# Patient Record
Sex: Female | Born: 1965 | Race: Black or African American | Hispanic: No | Marital: Married | Smoking: Never smoker
Health system: Southern US, Community
[De-identification: ages and names within clinical notes are randomized; demographics above are authoritative.]

## PROBLEM LIST (undated history)

## (undated) DIAGNOSIS — G43909 Migraine, unspecified, not intractable, without status migrainosus: Secondary | ICD-10-CM

## (undated) DIAGNOSIS — J45909 Unspecified asthma, uncomplicated: Secondary | ICD-10-CM

## (undated) DIAGNOSIS — Z86718 Personal history of other venous thrombosis and embolism: Secondary | ICD-10-CM

## (undated) DIAGNOSIS — E039 Hypothyroidism, unspecified: Secondary | ICD-10-CM

## (undated) DIAGNOSIS — I10 Essential (primary) hypertension: Secondary | ICD-10-CM

## (undated) DIAGNOSIS — K2950 Unspecified chronic gastritis without bleeding: Secondary | ICD-10-CM

## (undated) DIAGNOSIS — D126 Benign neoplasm of colon, unspecified: Secondary | ICD-10-CM

## (undated) DIAGNOSIS — D131 Benign neoplasm of stomach: Secondary | ICD-10-CM

## (undated) DIAGNOSIS — K219 Gastro-esophageal reflux disease without esophagitis: Secondary | ICD-10-CM

## (undated) DIAGNOSIS — K2210 Ulcer of esophagus without bleeding: Secondary | ICD-10-CM

## (undated) DIAGNOSIS — E119 Type 2 diabetes mellitus without complications: Secondary | ICD-10-CM

## (undated) DIAGNOSIS — G4730 Sleep apnea, unspecified: Secondary | ICD-10-CM

## (undated) DIAGNOSIS — Z8719 Personal history of other diseases of the digestive system: Secondary | ICD-10-CM

## (undated) HISTORY — PX: LAPAROSCOPIC SALPINGO OOPHERECTOMY: SHX5927

## (undated) HISTORY — PX: CHOLECYSTECTOMY: SHX55

---

## 2008-03-03 ENCOUNTER — Observation Stay (HOSPITAL_COMMUNITY): Admission: EM | Admit: 2008-03-03 | Discharge: 2008-03-07 | Payer: Self-pay | Admitting: Emergency Medicine

## 2008-08-04 ENCOUNTER — Emergency Department (HOSPITAL_COMMUNITY): Admission: EM | Admit: 2008-08-04 | Discharge: 2008-08-04 | Payer: Self-pay | Admitting: Emergency Medicine

## 2010-02-16 ENCOUNTER — Emergency Department (HOSPITAL_COMMUNITY): Admission: EM | Admit: 2010-02-16 | Discharge: 2010-02-16 | Payer: Self-pay | Admitting: Emergency Medicine

## 2011-03-18 NOTE — Discharge Summary (Signed)
Stacey Santos            ACCOUNT NO.:  [id number]   MEDICAL RECORD NO.:  [id number]          PATIENT TYPE:  INP   LOCATION:  A338                          FACILITY:  [facility]   PHYSICIAN:  Dorris Singh, DO    DATE OF BIRTH:  [birth year]   DATE OF ADMISSION:  03/03/2008  DATE OF DISCHARGE:  05/05/2009LH                               DISCHARGE SUMMARY   ADMISSION DIAGNOSIS:  1. Acute sciatica.  2. Pain management.  3. History of dental caries.   DISCHARGE DIAGNOSIS:  1. Acute sciatica.  2. Dyspepsia.  3. Pain management.  4. History of dental caries.   HISTORY OF PRESENT ILLNESS:  H&P was done by Dr. Dorris Singh, please  refer to that, but to summarize:  The patient is a 45 year old, African-  American female who presented to the [facility] emergency room with a  history of back pain that had been progressing over the last 2 days.  The patient was admitted to the service of [facility]. It was found that  she had acute sciatica.  The patient also had a lumbar spine done on Mar 04, 2008 which showed no acute process.  She was started on IV pain  medications as well as IV steroids and antiemetics.  She continued to  progress slowly.  PT, OT also saw her as well. After the first 24 hours,  she was able to get up and do some physical therapy but was in extreme  pain afterwards. She was also started on muscle relaxers. She continued  to progress on May 3. On May 2, we started her on anti-inflammatories  particularly Motrin. On May 3,  she started to complain of dyspepsia. At  that point in time, the Motrin was discontinued and the patient was  started on b.i.d. PPI. On the 4th, she continued to do better  progressing slowly.  She was able to have more range of motion,  able to  do more with physical therapy. On May 5, it was determined that she  could be discharged to home and manage this. We will have home health  care come out and see her as well as PT, OT to follow up  with her too.  She will be sent home on the following medications.  She was on  penicillin V 500 mg 4 times a day prior to coming here due to some  dental issues and also on hydrocodone acetaminophen as needed.  She will  be sent home with  new prescription for Protonix 40 mg p.o. b.i.d. while  on medications for sciatica, Skelaxin 800 mg p.o. b.i.d., Medrol Dosepak  to use as directed. She is to take Tylenol extra strength over-the-  counter as directed for pain and she will be given Darvocet-N 100 to  take 1 every 4-6 hours for breakthrough pain.  She is recommended to  follow up, since she does not have a primary care physician, with the  physician on-call who is Dr. Felecia Shelling in 1-3 days.  We will try to get her  scheduled by the end of this week.   CONDITION ON  DISCHARGE:  Stable.   DISPOSITION:  To home.   She is to follow up if her symptoms worsen with her primary care  physician or to the ER.      Dorris Singh, DO  Electronically Signed     CB/MEDQ  D:  03/07/2008  T:  03/07/2008  Job:  [id number]

## 2011-03-18 NOTE — H&P (Signed)
Stacey Santos, Stacey Santos            ACCOUNT NO.:  [id number]   MEDICAL RECORD NO.:  [id number]          PATIENT TYPE:  INP   LOCATION:  A338                          FACILITY:  [facility]   PHYSICIAN:  Dorris Singh, DO    DATE OF BIRTH:  12-19-1965   DATE OF ADMISSION:  03/03/2008  DATE OF DISCHARGE:  LH                              HISTORY & PHYSICAL   HISTORY:  The patient's primary care physician:  She is unassigned.   CHIEF COMPLAINT:  Back pain.   The patient is a 45 year old African-American female who presented to  the emergency room via ambulance, after having a three-day history of  excruciating back pain.  The patient stated that she tried home remedies  at home, which did not help.  The back pain extended from her buttocks,  down to her left heel.  The pain became more intense, rated a 10/10.  She was able to go to work in the morning and realized that, when she  was at work, it continued to get worse.  At that point in time, she  tried home remedies which did not relieve any pain.  She then called  EMS, who gave her 4 mg of morphine, while she was being transported to  the [facility] ED.  She was evaluated by Dr. Deretha Emory, and while the  patient was there she remained in excruciating pain.  She was unable to  move at that point in time, and it was determined the patient should be  admitted for pain management.   PAST MEDICAL HISTORY:  Significant for asthma, back pain, chronic deep  venous thrombosis.   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   MEDICATIONS:  She is on hydrocodone for pain management as needed for a  dental procedure, for dental pain and penicillin V potassium 4 times a  day for dental pain.   ALLERGIES:  THE PATIENT SAYS THAT SHE DOES NOT HAVE A TRUE ALLERGY TO  HYDROMORPHONE, JUST SHE DOES NOT LIKE HOW IT MAKES IT HER FEEL.   REVIEW OF SYSTEMS:  CONSTITUTIONAL:   Negative for weakness or appetite  changes or fever or chills.  HEENT:  Negative for ear pain, nose pain,  but positive for dental pain.  Negative for changes in vision.  HEART:  Negative for chest pain, palpitations.  PULMONARY:  Negative for dyspnea  or wheezing.  GI:  Negative for abdominal pain __________.  MUSCULOSKELETAL:  Positive for arthralgias and nerve pain.  NEUROLOGIC:  Positive for painful neuropathy or radiculopathy of the left lower leg.   PHYSICAL EXAMINATION:  VITAL SIGNS:  Are as follows:  Blood pressure  134/75, pulse rate 90, respirations 18, pulse ox 96%.  GENERAL:  The patient is a 45 year old African-American female who is  well-developed, well-nourished, in no acute distress.  HEENT:  Head is normocephalic, atraumatic.  Eyes are PERRLA, EOMI.  Teeth:  She does have dentition that is in fair repair.  NECK:  Supple, no lymphadenopathy noted.  HEART:  Regular rate  and rhythm, no murmur noted.  LUNGS:  Clear to auscultation bilaterally.  ABDOMEN:  Soft, nontender, nondistended.  EXTREMITIES:  The patient is laying on left side, is propped up.  There  is no addition swelling, as of left thigh region, however it is tender  to the touch.  There is no ecchymosis or edema noted.   LABORATORY:  Her labs are as follows:  Her BMET:  Sodium is 137,  potassium 3.0, chloride 103, carbon dioxide 27, glucose 106, BUN 4,  creatinine 0.65.  Her white count is 4.2, hemoglobin 11.1, hematocrit  32.0, platelet count 411.   ASSESSMENT/PLAN:  1. Acute sciatica.  2. Pain management.  3. History of dental caries.   PLAN:  Will be admit patient to the service of [facility].  Will  institute IV steroids and IV pain medications, as well as coverage for  glycemic protocol, antiemetics and DVT and GI prophylaxis.  Will  continue the patient on home medications that are appropriate, and will  also get a lumbar x-ray of the spine in the morning.  Will also get  muscle relaxers as well,  anti-spasmodics to see if that is helpful to  her and will have PT/OT come and see her, as well.  Anticipate discharge  in 1 to 2 days.      Dorris Singh, DO  Electronically Signed     CB/MEDQ  D:  03/03/2008  T:  03/03/2008  Job:  [id number]

## 2011-08-04 LAB — COMPREHENSIVE METABOLIC PANEL
Albumin: 3.5
BUN: 3 — ABNORMAL LOW
Calcium: 9.9
Creatinine, Ser: 0.69
GFR calc non Af Amer: >60
Total Protein: 7.5

## 2011-08-04 LAB — DIFFERENTIAL
Basophils Absolute: 0
Basophils Relative: 1
Eosinophils Absolute: 0
Eosinophils Relative: 1
Lymphocytes Relative: 43
Monocytes Absolute: 0.4

## 2011-08-04 LAB — CBC
HCT: 34.5 — ABNORMAL LOW
Hemoglobin: 11.2 — ABNORMAL LOW
RBC: 4.64
RDW: 17.9 — ABNORMAL HIGH
WBC: 4

## 2011-08-04 LAB — LIPASE, BLOOD: Lipase: 21

## 2012-05-20 ENCOUNTER — Encounter (HOSPITAL_COMMUNITY): Payer: Self-pay | Admitting: *Deleted

## 2012-05-20 ENCOUNTER — Emergency Department (HOSPITAL_COMMUNITY)
Admission: EM | Admit: 2012-05-20 | Discharge: 2012-05-20 | Disposition: A | Discharge status: Home / Self Care | Payer: Self-pay | Attending: Emergency Medicine | Admitting: Emergency Medicine

## 2012-05-20 DIAGNOSIS — G8929 Other chronic pain: Secondary | ICD-10-CM | POA: Insufficient documentation

## 2012-05-20 DIAGNOSIS — R509 Fever, unspecified: Secondary | ICD-10-CM | POA: Insufficient documentation

## 2012-05-20 DIAGNOSIS — J029 Acute pharyngitis, unspecified: Secondary | ICD-10-CM | POA: Insufficient documentation

## 2012-05-20 LAB — RAPID STREP SCREEN (MED CTR MEBANE ONLY): Streptococcus, Group A Screen (Direct): NEGATIVE

## 2012-05-20 MED ORDER — HYDROCODONE-ACETAMINOPHEN 5-325 MG PO TABS: ORAL_TABLET | ORAL | Status: AC

## 2012-05-20 MED ORDER — IBUPROFEN 800 MG PO TABS
800.0000 mg | ORAL_TABLET | Freq: Once | ORAL | Status: AC
  Administered 2012-05-20: 800 mg via ORAL
  Filled 2012-05-20: qty 1

## 2012-05-20 MED ORDER — IBUPROFEN 800 MG PO TABS: 800.0000 mg | ORAL_TABLET | Freq: Three times a day (TID) | ORAL | Status: AC

## 2012-05-20 MED ORDER — AZITHROMYCIN 250 MG PO TABS: ORAL_TABLET | ORAL | Status: DC

## 2012-05-20 MED ORDER — HYDROCODONE-ACETAMINOPHEN 5-325 MG PO TABS
1.0000 | ORAL_TABLET | Freq: Once | ORAL | Status: AC
  Administered 2012-05-20: 1 via ORAL
  Filled 2012-05-20: qty 1

## 2012-05-20 NOTE — ED Notes (Signed)
Pt c/o sore throat, fever, bilateral ear pain and weak since yesterday.

## 2012-05-20 NOTE — Discharge Instructions (Signed)

## 2012-05-20 NOTE — ED Provider Notes (Signed)
History     CSN: [id number]  Arrival date & time 05/20/12  1818   First MD Initiated Contact with Patient 05/20/12 1826      Chief Complaint  Patient presents with  . Sore Throat    (Consider location/radiation/quality/duration/timing/severity/associated sxs/prior treatment) Patient is a 46 y.o. female presenting with pharyngitis. The history is provided by the patient.  Sore Throat This is a new problem. The current episode started in the past 7 days. The problem occurs constantly. The problem has been unchanged. Associated symptoms include congestion, a fever and a sore throat. Pertinent negatives include no arthralgias, chest pain, chills, coughing, headaches, nausea, neck pain, numbness, rash, visual change, vomiting or weakness. Associated symptoms comments: Ear pain. Nothing aggravates the symptoms. She has tried nothing for the symptoms. The treatment provided no relief.    History reviewed. No pertinent past medical history.  History reviewed. No pertinent past surgical history.  History reviewed. No pertinent family history.  History  Substance Use Topics  . Smoking status: Never Smoker   . Smokeless tobacco: Not on file  . Alcohol Use: No    OB History    Grav Para Term Preterm Abortions TAB SAB Ect Mult Living                  Review of Systems  Constitutional: Positive for fever. Negative for chills, activity change and appetite change.  HENT: Positive for ear pain, congestion, sore throat and rhinorrhea. Negative for facial swelling, trouble swallowing, neck pain and neck stiffness.   Eyes: Negative for visual disturbance.  Respiratory: Negative for cough, shortness of breath, wheezing and stridor.   Cardiovascular: Negative for chest pain.  Gastrointestinal: Negative for nausea and vomiting.  Musculoskeletal: Negative for arthralgias.  Skin: Negative.  Negative for rash.  Neurological: Negative for dizziness, weakness, numbness and headaches.    Hematological: Negative for adenopathy.  Psychiatric/Behavioral: Negative for confusion.  All other systems reviewed and are negative.    Allergies  Review of patient's allergies indicates no known allergies.  Home Medications  No current outpatient prescriptions on file.  BP 157/86  Pulse 119  Temp 100.2 F (37.9 C) (Oral)  Resp 18  Ht 5\' 2"  (1.575 m)  Wt 210 lb (95.255 kg)  BMI 38.41 kg/m2  SpO2 99%  LMP 05/11/2012  Physical Exam  Nursing note and vitals reviewed. Constitutional: She is oriented to person, place, and time. She appears well-developed and well-nourished. No distress.  HENT:  Head: Normocephalic and atraumatic. No trismus in the jaw.  Right Ear: Tympanic membrane and ear canal normal.  Left Ear: Tympanic membrane and ear canal normal.  Mouth/Throat: Uvula is midline and mucous membranes are normal. No uvula swelling. Posterior oropharyngeal edema and posterior oropharyngeal erythema present. No oropharyngeal exudate or tonsillar abscesses.  Neck: Normal range of motion. Neck supple.  Cardiovascular: Normal rate, regular rhythm and normal heart sounds.   Pulmonary/Chest: Effort normal and breath sounds normal.  Abdominal: Soft. She exhibits no distension. There is no tenderness.  Musculoskeletal: Normal range of motion.  Lymphadenopathy:    She has no cervical adenopathy.  Neurological: She is alert and oriented to person, place, and time. She exhibits normal muscle tone. Coordination normal.  Skin: Skin is warm and dry.    ED Course  Procedures (including critical care time)  Results for orders placed during the hospital encounter of 05/20/12  RAPID STREP SCREEN      Component Value Range   Streptococcus, Group A Screen (Direct)  NEGATIVE  NEGATIVE         MDM     Patient is feeling better, erythema of the bilateral tonsils.  No exudates. Uvula is midline.  Handles own secretions well.  Non-toxic appearing.  The patient appears reasonably  screened and/or stabilized for discharge and I doubt any other medical condition or other [facility] requiring further screening, evaluation, or treatment in the ED at this time prior to discharge.   Patient / Family / Caregiver understand and agree with initial ED impression and plan with expectations set for ED visit. Pt stable in ED with no significant deterioration in condition. Pt feels improved after observation and/or treatment in ED.    Prescribed: zithromax norco #10 ibuprofen        Constantine Ruddick L. [place], [place] 05/26/12 2216

## 2012-05-20 NOTE — ED Notes (Signed)
Sore throat, ears hurt,  Hoarse.  Fever ,onset yesterday

## 2012-05-28 NOTE — ED Provider Notes (Signed)
Medical screening examination/treatment/procedure(s) were performed by non-physician practitioner and as supervising physician I was immediately available for consultation/collaboration.  Farmer Mccahill, MD 05/28/12 0744 

## 2014-07-25 DIAGNOSIS — K219 Gastro-esophageal reflux disease without esophagitis: Secondary | ICD-10-CM | POA: Insufficient documentation

## 2014-07-25 DIAGNOSIS — M545 Low back pain, unspecified: Secondary | ICD-10-CM | POA: Insufficient documentation

## 2014-07-31 ENCOUNTER — Ambulatory Visit: Payer: Self-pay | Admitting: Internal Medicine

## 2014-08-17 DIAGNOSIS — M5136 Other intervertebral disc degeneration, lumbar region: Secondary | ICD-10-CM | POA: Insufficient documentation

## 2014-08-31 DIAGNOSIS — M160 Bilateral primary osteoarthritis of hip: Secondary | ICD-10-CM | POA: Insufficient documentation

## 2014-12-21 ENCOUNTER — Ambulatory Visit: Payer: Self-pay | Admitting: Internal Medicine

## 2015-04-09 DIAGNOSIS — D649 Anemia, unspecified: Secondary | ICD-10-CM | POA: Insufficient documentation

## 2015-04-30 ENCOUNTER — Encounter: Payer: Self-pay | Admitting: *Deleted

## 2015-05-01 ENCOUNTER — Ambulatory Visit: Payer: BLUE CROSS/BLUE SHIELD | Admitting: Anesthesiology

## 2015-05-01 ENCOUNTER — Ambulatory Visit
Admission: RE | Admit: 2015-05-01 | Discharge: 2015-05-01 | Disposition: A | Discharge status: Home / Self Care | Payer: BLUE CROSS/BLUE SHIELD | Source: Ambulatory Visit | Attending: Gastroenterology | Admitting: Gastroenterology

## 2015-05-01 ENCOUNTER — Encounter
Admission: RE | Disposition: A | Discharge status: Home / Self Care | Payer: Self-pay | Source: Ambulatory Visit | Attending: Gastroenterology

## 2015-05-01 ENCOUNTER — Encounter: Payer: Self-pay | Admitting: *Deleted

## 2015-05-01 DIAGNOSIS — Z7951 Long term (current) use of inhaled steroids: Secondary | ICD-10-CM | POA: Diagnosis not present

## 2015-05-01 DIAGNOSIS — E669 Obesity, unspecified: Secondary | ICD-10-CM | POA: Diagnosis not present

## 2015-05-01 DIAGNOSIS — Z86010 Personal history of colonic polyps: Secondary | ICD-10-CM | POA: Diagnosis not present

## 2015-05-01 DIAGNOSIS — D124 Benign neoplasm of descending colon: Secondary | ICD-10-CM | POA: Diagnosis not present

## 2015-05-01 DIAGNOSIS — Z6841 Body Mass Index (BMI) 40.0 and over, adult: Secondary | ICD-10-CM | POA: Diagnosis not present

## 2015-05-01 DIAGNOSIS — Z79899 Other long term (current) drug therapy: Secondary | ICD-10-CM | POA: Insufficient documentation

## 2015-05-01 DIAGNOSIS — K449 Diaphragmatic hernia without obstruction or gangrene: Secondary | ICD-10-CM | POA: Diagnosis not present

## 2015-05-01 DIAGNOSIS — J45909 Unspecified asthma, uncomplicated: Secondary | ICD-10-CM | POA: Diagnosis not present

## 2015-05-01 DIAGNOSIS — K2950 Unspecified chronic gastritis without bleeding: Secondary | ICD-10-CM | POA: Diagnosis not present

## 2015-05-01 DIAGNOSIS — K317 Polyp of stomach and duodenum: Secondary | ICD-10-CM | POA: Diagnosis not present

## 2015-05-01 DIAGNOSIS — K219 Gastro-esophageal reflux disease without esophagitis: Secondary | ICD-10-CM | POA: Insufficient documentation

## 2015-05-01 DIAGNOSIS — Z1211 Encounter for screening for malignant neoplasm of colon: Secondary | ICD-10-CM | POA: Diagnosis present

## 2015-05-01 HISTORY — DX: Personal history of other venous thrombosis and embolism: Z86.718

## 2015-05-01 HISTORY — DX: Unspecified asthma, uncomplicated: J45.909

## 2015-05-01 HISTORY — PX: ESOPHAGOGASTRODUODENOSCOPY: SHX5428

## 2015-05-01 HISTORY — PX: COLONOSCOPY WITH PROPOFOL: SHX5780

## 2015-05-01 HISTORY — DX: Migraine, unspecified, not intractable, without status migrainosus: G43.909

## 2015-05-01 SURGERY — COLONOSCOPY WITH PROPOFOL
Anesthesia: General

## 2015-05-01 MED ORDER — PROPOFOL 10 MG/ML IV BOLUS
INTRAVENOUS | Status: DC | PRN
  Administered 2015-05-01: 20 mg via INTRAVENOUS
  Administered 2015-05-01 (×2): 30 mg via INTRAVENOUS
  Administered 2015-05-01: 50 mg via INTRAVENOUS

## 2015-05-01 MED ORDER — LIDOCAINE HCL (CARDIAC) 20 MG/ML IV SOLN
INTRAVENOUS | Status: DC | PRN
  Administered 2015-05-01: 100 mg via INTRAVENOUS

## 2015-05-01 MED ORDER — FENTANYL CITRATE (PF) 100 MCG/2ML IJ SOLN
25.0000 ug | INTRAMUSCULAR | Status: DC | PRN
  Administered 2015-05-01: 50 ug via INTRAVENOUS

## 2015-05-01 MED ORDER — SODIUM CHLORIDE 0.9 % IV SOLN
INTRAVENOUS | Status: DC
  Administered 2015-05-01: 09:00:00 via INTRAVENOUS
  Administered 2015-05-01: 1000 mL via INTRAVENOUS

## 2015-05-01 MED ORDER — ONDANSETRON HCL 4 MG/2ML IJ SOLN: 4.0000 mg | Freq: Once | INTRAMUSCULAR | Status: DC | PRN

## 2015-05-01 MED ORDER — MIDAZOLAM HCL 5 MG/5ML IJ SOLN
INTRAMUSCULAR | Status: DC | PRN
  Administered 2015-05-01: 2 mg via INTRAVENOUS

## 2015-05-01 MED ORDER — EPHEDRINE SULFATE 50 MG/ML IJ SOLN
INTRAMUSCULAR | Status: DC | PRN
  Administered 2015-05-01: 15 mg via INTRAVENOUS

## 2015-05-01 MED ORDER — PROPOFOL INFUSION 10 MG/ML OPTIME
INTRAVENOUS | Status: DC | PRN
Start: 2015-05-01 — End: 2015-05-01
  Administered 2015-05-01 (×2): 140 ug/kg/min via INTRAVENOUS

## 2015-05-01 NOTE — Op Note (Signed)
[facility] Patient Name: Stacey Santos Procedure Date: 05/01/2015 8:54 AM MRN: [id number] Account #: [id number] Date of Birth: 01/24/1966 Admit Type: Outpatient Age: 49 Room: [facility] ENDO ROOM 3 Gender: Female Note Status: Finalized Procedure:         Colonoscopy Indications:       Screening for colorectal malignant neoplasm, This is the                     patient's first colonoscopy Providers:         Lollie Sails, MD Referring MD:      Glendon Axe (Referring MD) Medicines:         Monitored Anesthesia Care Complications:     No immediate complications. Procedure:         Pre-Anesthesia Assessment:                    - ASA Grade Assessment: III - A patient with severe                     systemic disease.                    After obtaining informed consent, the colonoscope was                     passed under direct vision. Throughout the procedure, the                     patient's blood pressure, pulse, and oxygen saturations                     were monitored continuously. The Colonoscope was                     introduced through the anus and advanced to the the cecum,                     identified by appendiceal orifice and ileocecal valve. The                     colonoscopy was performed with moderate difficulty due to                     a tortuous colon and the patient's body habitus.                     Successful completion of the procedure was aided by                     changing the patient to a prone position and using manual                     pressure. The patient tolerated the procedure well. The                     quality of the bowel preparation was good. Findings:      A 9 mm polyp was found in the proximal descending colon. The polyp was       pedunculated. The polyp was removed with a hot snare. Resection and       retrieval were complete. One hemostatic clip was successfully placed.       There was no  bleeding at the end of the maneuver.  The digital rectal exam was normal.      The exam was otherwise without abnormality. Impression:        - One 9 mm polyp in the proximal descending colon.                     Resected and retrieved. Clip was placed.                    - The examination was otherwise normal. Recommendation:    - Await pathology results.                    - Telephone GI clinic for pathology results in 1 week. Procedure Code(s): --- Professional ---                    [id number], Colonoscopy, flexible; with removal of tumor(s),                     polyp(s), or other lesion(s) by snare technique Diagnosis Code(s): --- Professional ---                    V76.51, Special screening for malignant neoplasms of colon                    211.3, Benign neoplasm of colon CPT copyright 2014 American Medical Association. All rights reserved. The codes documented in this report are preliminary and upon coder review may  be revised to meet current compliance requirements. Lollie Sails, MD 05/01/2015 10:16:15 AM This report has been signed electronically. Number of Addenda: 0 Note Initiated On: 05/01/2015 8:54 AM Scope Withdrawal Time: 0 hours 4 minutes 59 seconds  Total Procedure Duration: 0 hours 24 minutes 17 seconds       [facility]

## 2015-05-01 NOTE — Anesthesia Postprocedure Evaluation (Signed)
  Anesthesia Post-op Note  Patient: Stacey Santos  Procedure(s) Performed: Procedure(s): COLONOSCOPY WITH PROPOFOL (N/A) ESOPHAGOGASTRODUODENOSCOPY (EGD) (N/A)  Anesthesia type:General  Patient location: PACU  Post pain: Pain level controlled  Post assessment: Post-op Vital signs reviewed, Patient's Cardiovascular Status Stable, Respiratory Function Stable, Patent Airway and No signs of Nausea or vomiting  Post vital signs: Reviewed and stable  Last Vitals:  Filed Vitals:   05/01/15 1105  BP: 109/81  Pulse: 90  Temp:   Resp: 19    Level of consciousness: awake, alert  and patient cooperative  Complications: No apparent anesthesia complications

## 2015-05-01 NOTE — OR Nursing (Signed)
Hx of bilateral tubal ligation

## 2015-05-01 NOTE — Op Note (Signed)
[facility] Patient Name: Stacey Santos Procedure Date: 05/01/2015 8:54 AM MRN: [id number] Account #: [id number] Date of Birth: January 20, 1966 Admit Type: Outpatient Age: 49 Room: [facility] ENDO ROOM 3 Gender: Female Note Status: Finalized Procedure:         Upper GI endoscopy Indications:       Gastro-esophageal reflux disease Providers:         Lollie Sails, MD Referring MD:      Glendon Axe (Referring MD) Medicines:         Monitored Anesthesia Care Complications:     No immediate complications. Procedure:         Pre-Anesthesia Assessment:                    - ASA Grade Assessment: III - A patient with severe                     systemic disease.                    After obtaining informed consent, the endoscope was passed                     under direct vision. Throughout the procedure, the                     patient's blood pressure, pulse, and oxygen saturations                     were monitored continuously. The Olympus GIF-160 endoscope                     (S#. [id number]) was introduced through the mouth, and                     advanced to the fourth part of duodenum. The patient                     tolerated the procedure well. Findings:      LA Grade C (one or more mucosal breaks continuous between tops of 2 or       more mucosal folds, less than 75% circumference) esophagitis with no       bleeding was found. Biopsies were taken with a cold forceps for       histology.      A small hiatus hernia was found. The Z-line was a variable distance from       incisors; the hiatal hernia was sliding.      Diffuse mildly erythematous mucosa without bleeding was found in the       gastric body. Biopsies were taken with a cold forceps for histology.      The cardia and gastric fundus were normal on retroflexion.      The examined duodenum was normal.      Multiple small pedunculated and sessile polyps with no bleeding and no       stigmata  of recent bleeding were found in the gastric body. Biopsies       were taken with a cold forceps for histology. Impression:        - LA Grade C erosive esophagitis. Biopsied.                    - Small hiatus hernia.                    -  Erythematous mucosa in the gastric body. Biopsied.                    - Normal examined duodenum. Recommendation:    - Continue present medications.                    - Use sucralfate tablets 1 gram PO QID for 2 months.                    - Return to GI clinic in 6 weeks. Procedure Code(s): --- Professional ---                    [phone], Esophagogastroduodenoscopy, flexible, transoral;                     with biopsy, single or multiple Diagnosis Code(s): --- Professional ---                    530.19, Other esophagitis                    537.89, Other specified disorders of stomach and duodenum                    530.81, Esophageal reflux                    553.3, Diaphragmatic hernia without mention of obstruction                     or gangrene CPT copyright 2014 American Medical Association. All rights reserved. The codes documented in this report are preliminary and upon coder review may  be revised to meet current compliance requirements. Lollie Sails, MD 05/01/2015 9:32:54 AM This report has been signed electronically. Number of Addenda: 0 Note Initiated On: 05/01/2015 8:54 AM      [facility]

## 2015-05-01 NOTE — Transfer of Care (Signed)
Immediate Anesthesia Transfer of Care Note  Patient: Stacey Santos  Procedure(s) Performed: Procedure(s): COLONOSCOPY WITH PROPOFOL (N/A) ESOPHAGOGASTRODUODENOSCOPY (EGD) (N/A)  Patient Location: PACU  Anesthesia Type:General  Level of Consciousness: awake and patient cooperative  Airway & Oxygen Therapy: Patient Spontanous Breathing and Patient connected to nasal cannula oxygen  Post-op Assessment: Report given to RN and Post -op Vital signs reviewed and stable  Post vital signs: Reviewed and stable  Last Vitals:  Filed Vitals:   05/01/15 0819  BP: 111/94  Pulse: 107  Temp: 36.2 C  Resp: 16    Complications: No apparent anesthesia complications

## 2015-05-01 NOTE — Anesthesia Preprocedure Evaluation (Addendum)
Anesthesia Evaluation  Patient identified by MRN, date of birth, ID band Patient awake    Reviewed: Allergy & Precautions, NPO status , Patient's Chart, lab work & pertinent test results  Airway Mallampati: II  TM Distance: >3 FB Neck ROM: Full    Dental no notable dental hx.    Pulmonary asthma ,  breath sounds clear to auscultation  Pulmonary exam normal       Cardiovascular negative cardio ROS Normal cardiovascular exam    Neuro/Psych  Headaches, negative psych ROS   GI/Hepatic Neg liver ROS, Hx of colon polyps   Endo/Other  negative endocrine ROS  Renal/GU negative Renal ROS  negative genitourinary   Musculoskeletal negative musculoskeletal ROS (+)   Abdominal (+) + obese,  Abdomen: soft.    Peds negative pediatric ROS (+)  Hematology Hx of blood clots   Anesthesia Other Findings Patient has had complete fallopian tube removal bilaterally and denies any chance aof pregnancy.  Pt has been informed of potential risk to any fetus, and she agrees to accept the risk ... So preg test was not done.  Reproductive/Obstetrics                            Anesthesia Physical Anesthesia Plan  ASA: II  Anesthesia Plan: General   Post-op Pain Management:    Induction: Intravenous  Airway Management Planned: Nasal Cannula  Additional Equipment:   Intra-op Plan:   Post-operative Plan:   Informed Consent: I have reviewed the patients History and Physical, chart, labs and discussed the procedure including the risks, benefits and alternatives for the proposed anesthesia with the patient or authorized representative who has indicated his/her understanding and acceptance.   Dental advisory given  Plan Discussed with: CRNA and Surgeon  Anesthesia Plan Comments:         Anesthesia Quick Evaluation

## 2015-05-01 NOTE — Anesthesia Procedure Notes (Signed)
Performed by: Allean Found Pre-anesthesia Checklist: Patient identified, Emergency Drugs available, Suction available, Patient being monitored and Timeout performed Patient Re-evaluated:Patient Re-evaluated prior to inductionOxygen Delivery Method: Nasal cannula

## 2015-05-01 NOTE — H&P (Addendum)
Outpatient short stay form Pre-procedure 05/01/2015 8:50 AM Lollie Sails MD  Primary Physician: Dr. Glendon Axe  Reason for visit:  EGD and colonoscopy  History of present illness:  Patient is a 49 year old female presenting today for evaluation in regards to reflux symptoms and screening colonoscopy. Does take meloxicam. She has been taking pantoprazole yearly as well. There is no family history of colon polyps or colon cancer.    Current facility-administered medications:  .  0.9 %  sodium chloride infusion, , Intravenous, Continuous, Lollie Sails, MD  Prescriptions prior to admission  Medication Sig Dispense Refill Last Dose  . albuterol (PROVENTIL HFA;VENTOLIN HFA) 108 (90 BASE) MCG/ACT inhaler Inhale into the lungs every 6 (six) hours as needed for wheezing or shortness of breath.     Marland Kitchen amLODipine (NORVASC) 5 MG tablet Take 5 mg by mouth daily.     . cyclobenzaprine (FLEXERIL) 10 MG tablet Take 10 mg by mouth 2 (two) times daily.     . DULoxetine (CYMBALTA) 30 MG capsule Take 30 mg by mouth daily.     Marland Kitchen HYDROcodone-acetaminophen (NORCO) 7.5-325 MG per tablet Take 1 tablet by mouth 2 (two) times daily.     . meloxicam (MOBIC) 15 MG tablet Take 15 mg by mouth daily.     . pantoprazole (PROTONIX) 40 MG tablet Take 40 mg by mouth daily.     Marland Kitchen azithromycin (ZITHROMAX Z-PAK) 250 MG tablet Take two tablets on day one, then one tab qd days 2-5 (Patient not taking: Reported on 05/01/2015) 6 tablet 0 Completed Course at Unknown time     No Known Allergies   Past Medical History  Diagnosis Date  . Asthma   . Migraine   . History of blood clots     Review of systems:      Physical Exam    Heart and lungs: Regular rate and rhythm without rub or gallop lungs are bilaterally clear    HEENT: Normocephalic atraumatic eyes are anicteric    Other:     Pertinant exam for procedure: Soft nontender nondistended bowel sounds positive normoactive    Planned proceedures:  EGD and colonoscopy. I have discussed the risks benefits and complications of procedures to include not limited to bleeding, infection, perforation and the risk of sedation and the patient wishes to proceed.    Lollie Sails, MD Gastroenterology 05/01/2015  8:50 AM     Addendum. Patient states that she had a salpingo- oophorectomy. She has a partial ovary. Both tubes were removed surgically

## 2015-05-02 ENCOUNTER — Encounter: Payer: Self-pay | Admitting: Gastroenterology

## 2015-05-02 LAB — SURGICAL PATHOLOGY

## 2016-01-16 ENCOUNTER — Other Ambulatory Visit: Payer: Self-pay | Admitting: Internal Medicine

## 2016-01-16 DIAGNOSIS — Z1239 Encounter for other screening for malignant neoplasm of breast: Secondary | ICD-10-CM

## 2016-01-16 DIAGNOSIS — Z1231 Encounter for screening mammogram for malignant neoplasm of breast: Secondary | ICD-10-CM

## 2016-01-17 ENCOUNTER — Other Ambulatory Visit: Payer: Self-pay | Admitting: Physical Medicine and Rehabilitation

## 2016-01-17 DIAGNOSIS — M7062 Trochanteric bursitis, left hip: Secondary | ICD-10-CM

## 2016-01-17 DIAGNOSIS — M25552 Pain in left hip: Secondary | ICD-10-CM

## 2016-02-01 ENCOUNTER — Ambulatory Visit
Admission: RE | Admit: 2016-02-01 | Discharge: 2016-02-01 | Disposition: A | Discharge status: Home / Self Care | Payer: BLUE CROSS/BLUE SHIELD | Source: Ambulatory Visit | Attending: Internal Medicine | Admitting: Internal Medicine

## 2016-02-01 DIAGNOSIS — Z1231 Encounter for screening mammogram for malignant neoplasm of breast: Secondary | ICD-10-CM | POA: Diagnosis present

## 2016-02-01 DIAGNOSIS — Z1239 Encounter for other screening for malignant neoplasm of breast: Secondary | ICD-10-CM

## 2016-02-06 ENCOUNTER — Ambulatory Visit
Admission: RE | Admit: 2016-02-06 | Discharge: 2016-02-06 | Disposition: A | Discharge status: Home / Self Care | Payer: BLUE CROSS/BLUE SHIELD | Source: Ambulatory Visit | Attending: Physical Medicine and Rehabilitation | Admitting: Physical Medicine and Rehabilitation

## 2016-02-06 DIAGNOSIS — M25552 Pain in left hip: Secondary | ICD-10-CM

## 2016-02-06 DIAGNOSIS — D259 Leiomyoma of uterus, unspecified: Secondary | ICD-10-CM | POA: Insufficient documentation

## 2016-02-06 DIAGNOSIS — M7062 Trochanteric bursitis, left hip: Secondary | ICD-10-CM | POA: Diagnosis present

## 2018-01-23 DIAGNOSIS — R0683 Snoring: Secondary | ICD-10-CM | POA: Insufficient documentation

## 2018-04-23 DIAGNOSIS — Z780 Asymptomatic menopausal state: Secondary | ICD-10-CM | POA: Insufficient documentation

## 2018-04-23 DIAGNOSIS — D126 Benign neoplasm of colon, unspecified: Secondary | ICD-10-CM | POA: Insufficient documentation

## 2018-05-24 ENCOUNTER — Other Ambulatory Visit: Payer: Self-pay | Admitting: Internal Medicine

## 2018-05-24 DIAGNOSIS — Z1231 Encounter for screening mammogram for malignant neoplasm of breast: Secondary | ICD-10-CM

## 2018-07-23 DIAGNOSIS — N951 Menopausal and female climacteric states: Secondary | ICD-10-CM | POA: Insufficient documentation

## 2018-07-29 ENCOUNTER — Encounter: Payer: Self-pay | Admitting: *Deleted

## 2018-07-30 ENCOUNTER — Ambulatory Visit: Payer: BLUE CROSS/BLUE SHIELD | Admitting: Certified Registered Nurse Anesthetist

## 2018-07-30 ENCOUNTER — Ambulatory Visit
Admission: RE | Admit: 2018-07-30 | Discharge: 2018-07-30 | Disposition: A | Discharge status: Home / Self Care | Payer: BLUE CROSS/BLUE SHIELD | Source: Ambulatory Visit | Attending: Gastroenterology | Admitting: Gastroenterology

## 2018-07-30 ENCOUNTER — Encounter: Payer: Self-pay | Admitting: Anesthesiology

## 2018-07-30 ENCOUNTER — Encounter
Admission: RE | Disposition: A | Discharge status: Home / Self Care | Payer: Self-pay | Source: Ambulatory Visit | Attending: Gastroenterology

## 2018-07-30 DIAGNOSIS — Z7984 Long term (current) use of oral hypoglycemic drugs: Secondary | ICD-10-CM | POA: Insufficient documentation

## 2018-07-30 DIAGNOSIS — Z86010 Personal history of colonic polyps: Secondary | ICD-10-CM | POA: Insufficient documentation

## 2018-07-30 DIAGNOSIS — Z6841 Body Mass Index (BMI) 40.0 and over, adult: Secondary | ICD-10-CM | POA: Diagnosis not present

## 2018-07-30 DIAGNOSIS — Z1211 Encounter for screening for malignant neoplasm of colon: Secondary | ICD-10-CM | POA: Diagnosis present

## 2018-07-30 DIAGNOSIS — Z5309 Procedure and treatment not carried out because of other contraindication: Secondary | ICD-10-CM | POA: Insufficient documentation

## 2018-07-30 DIAGNOSIS — Z79899 Other long term (current) drug therapy: Secondary | ICD-10-CM | POA: Insufficient documentation

## 2018-07-30 DIAGNOSIS — Z86718 Personal history of other venous thrombosis and embolism: Secondary | ICD-10-CM | POA: Insufficient documentation

## 2018-07-30 DIAGNOSIS — G4733 Obstructive sleep apnea (adult) (pediatric): Secondary | ICD-10-CM | POA: Diagnosis not present

## 2018-07-30 DIAGNOSIS — J45909 Unspecified asthma, uncomplicated: Secondary | ICD-10-CM | POA: Insufficient documentation

## 2018-07-30 DIAGNOSIS — G43909 Migraine, unspecified, not intractable, without status migrainosus: Secondary | ICD-10-CM | POA: Insufficient documentation

## 2018-07-30 DIAGNOSIS — E119 Type 2 diabetes mellitus without complications: Secondary | ICD-10-CM | POA: Insufficient documentation

## 2018-07-30 DIAGNOSIS — E669 Obesity, unspecified: Secondary | ICD-10-CM | POA: Insufficient documentation

## 2018-07-30 DIAGNOSIS — I10 Essential (primary) hypertension: Secondary | ICD-10-CM | POA: Diagnosis not present

## 2018-07-30 DIAGNOSIS — E039 Hypothyroidism, unspecified: Secondary | ICD-10-CM | POA: Diagnosis not present

## 2018-07-30 DIAGNOSIS — K219 Gastro-esophageal reflux disease without esophagitis: Secondary | ICD-10-CM | POA: Diagnosis not present

## 2018-07-30 HISTORY — DX: Essential (primary) hypertension: I10

## 2018-07-30 HISTORY — DX: Type 2 diabetes mellitus without complications: E11.9

## 2018-07-30 HISTORY — DX: Sleep apnea, unspecified: G47.30

## 2018-07-30 HISTORY — DX: Hypothyroidism, unspecified: E03.9

## 2018-07-30 HISTORY — DX: Gastro-esophageal reflux disease without esophagitis: K21.9

## 2018-07-30 HISTORY — PX: COLONOSCOPY WITH PROPOFOL: SHX5780

## 2018-07-30 LAB — GLUCOSE, CAPILLARY: Glucose-Capillary: 118 mg/dL — ABNORMAL HIGH (ref 70–99)

## 2018-07-30 SURGERY — COLONOSCOPY WITH PROPOFOL
Anesthesia: General

## 2018-07-30 MED ORDER — GLYCOPYRROLATE 0.2 MG/ML IJ SOLN
INTRAMUSCULAR | Status: DC | PRN
  Administered 2018-07-30: 0.2 mg via INTRAVENOUS

## 2018-07-30 MED ORDER — PHENYLEPHRINE HCL 10 MG/ML IJ SOLN
INTRAMUSCULAR | Status: DC | PRN
  Administered 2018-07-30: 150 ug via INTRAVENOUS

## 2018-07-30 MED ORDER — SODIUM CHLORIDE 0.9 % IV SOLN
INTRAVENOUS | Status: DC
  Administered 2018-07-30: 1000 mL via INTRAVENOUS

## 2018-07-30 MED ORDER — PROPOFOL 500 MG/50ML IV EMUL
INTRAVENOUS | Status: AC
  Filled 2018-07-30: qty 50

## 2018-07-30 MED ORDER — PROPOFOL 10 MG/ML IV BOLUS
INTRAVENOUS | Status: DC | PRN
  Administered 2018-07-30: 80 mg via INTRAVENOUS

## 2018-07-30 MED ORDER — PROPOFOL 500 MG/50ML IV EMUL
INTRAVENOUS | Status: DC | PRN
  Administered 2018-07-30: 160 ug/kg/min via INTRAVENOUS

## 2018-07-30 NOTE — Op Note (Signed)
[facility] Gastroenterology Patient Name: Stacey Santos Procedure Date: 07/30/2018 9:38 AM MRN: [id number] Account #: [id number] Date of Birth: 1966-03-12 Admit Type: Outpatient Age: 52 Room: [facility] ENDO ROOM 1 Gender: Female Note Status: Finalized Procedure:            Colonoscopy Indications:          Personal history of colonic polyps Providers:            Lollie Sails, MD Referring MD:         Glendon Axe (Referring MD) Medicines:            Monitored Anesthesia Care Complications:        No immediate complications. Procedure:            Pre-Anesthesia Assessment:                       - ASA Grade Assessment: III - A patient with severe                        systemic disease.                       After obtaining informed consent, the colonoscope was                        passed under direct vision. Throughout the procedure,                        the patient's blood pressure, pulse, and oxygen                        saturations were monitored continuously. The                        Colonoscope was introduced through the anus with the                        intention of advancing to the cecum. The scope was                        advanced to the sigmoid colon before the procedure was                        aborted. Medications were given. The colonoscopy was                        unusually difficult due to poor bowel prep with stool                        present. The patient tolerated the procedure well. The                        quality of the bowel preparation was poor. Findings:      A large amount of semi-solid solid stool was found in the rectum and in       the sigmoid colon, precluding visualization.      The digital rectal exam was normal. Impression:           - Preparation of the colon was poor.                       -  Stool in the rectum and in the sigmoid colon.                       - No specimens collected. Recommendation:        - reprep and reschedule.                       - Discharge patient to home [Means]. Lollie Sails, MD 07/30/2018 9:53:51 AM This report has been signed electronically. Number of Addenda: 0 Note Initiated On: 07/30/2018 9:38 AM Total Procedure Duration: 0 hours 6 minutes 7 seconds       [facility]

## 2018-07-30 NOTE — H&P (Signed)
Outpatient short stay form Pre-procedure 07/30/2018 9:20 AM Lollie Sails MD  Primary Physician: Dr. Glendon Axe  Reason for visit: Colonoscopy  History of present illness: Patient is a 52 year old female presenting today as above.  She has personal history of adenomatous colon polyps.  Last colonoscopy was 05/01/2015 with a tubular adenoma x1.  Patient tolerated her prep well.  She takes no aspirin or blood thinning agent.  He does have a history of obstructive sleep apnea.    Current Facility-Administered Medications:  .  0.9 %  sodium chloride infusion, , Intravenous, Continuous, Lollie Sails, MD  Medications Prior to Admission  Medication Sig Dispense Refill Last Dose  . hydrochlorothiazide (HYDRODIURIL) 12.5 MG tablet Take 12.5 mg by mouth daily. As needed for swelling of legs     . hydrochlorothiazide (HYDRODIURIL) 12.5 MG tablet Take 12.5 mg by mouth daily.     . lansoprazole (PREVACID) 30 MG capsule Take 30 mg by mouth daily. DR capsule     . levothyroxine (SYNTHROID, LEVOTHROID) 25 MCG tablet Take 25 mcg by mouth daily. Take with a glass of water 30-60 minutes before breakfast     . metFORMIN (GLUCOPHAGE) 500 MG tablet Take by mouth 2 (two) times daily with a meal.     . metoprolol succinate (TOPROL-XL) 50 MG 24 hr tablet Take 50 mg by mouth daily. Take with or immediately following a meal.   07/29/2018 at Unknown time  . ondansetron (ZOFRAN) 4 MG tablet Take 4 mg by mouth every 8 (eight) hours as needed for nausea.     Marland Kitchen tiZANidine (ZANAFLEX) 4 MG tablet Take 4 mg by mouth at bedtime. As needed     . traMADol (ULTRAM) 50 MG tablet Take by mouth every 6 (six) hours as needed.     Marland Kitchen albuterol (PROVENTIL HFA;VENTOLIN HFA) 108 (90 BASE) MCG/ACT inhaler Inhale into the lungs every 4 (four) hours as needed for wheezing or shortness of breath.      Marland Kitchen amLODipine (NORVASC) 5 MG tablet Take 5 mg by mouth daily.     . DULoxetine (CYMBALTA) 30 MG capsule Take 30 mg by mouth daily.      . meloxicam (MOBIC) 15 MG tablet Take 15 mg by mouth daily.        No Known Allergies   Past Medical History:  Diagnosis Date  . Asthma   . Diabetes mellitus without complication ([place])   . GERD (gastroesophageal reflux disease)   . History of blood clots   . Hypertension   . Hypothyroidism   . Migraine   . Sleep apnea     Review of systems:      Physical Exam    Heart and lungs: Regular rate and rhythm without rub or gallop, lungs are bilaterally clear.    HEENT: Normocephalic atraumatic eyes are anicteric    Other:    Pertinant exam for procedure: Soft nontender nondistended bowel sounds positive normoactive.    Planned proceedures: Colonoscopy and indicated procedures. I have discussed the risks benefits and complications of procedures to include not limited to bleeding, infection, perforation and the risk of sedation and the patient wishes to proceed.    Lollie Sails, MD Gastroenterology 07/30/2018  9:20 AM

## 2018-07-30 NOTE — Anesthesia Post-op Follow-up Note (Signed)
Anesthesia QCDR form completed.        

## 2018-07-30 NOTE — Anesthesia Procedure Notes (Signed)
Performed by: Montague Corella, CRNA Pre-anesthesia Checklist: Patient identified, Emergency Drugs available, Suction available, Patient being monitored and Timeout performed Patient Re-evaluated:Patient Re-evaluated prior to induction Oxygen Delivery Method: Nasal cannula Induction Type: IV induction       

## 2018-07-30 NOTE — Transfer of Care (Signed)
Immediate Anesthesia Transfer of Care Note  Patient: Stacey Santos  Procedure(s) Performed: COLONOSCOPY WITH PROPOFOL (N/A )  Patient Location: PACU  Anesthesia Type:General  Level of Consciousness: drowsy  Airway & Oxygen Therapy: Patient Spontanous Breathing and Patient connected to nasal cannula oxygen  Post-op Assessment: Report given to RN and Post -op Vital signs reviewed and stable  Post vital signs: Reviewed and stable  Last Vitals:  Vitals Value Taken Time  BP 93/61 07/30/2018  9:53 AM  Temp 36.2 C 07/30/2018  9:53 AM  Pulse 82 07/30/2018  9:53 AM  Resp 12 07/30/2018  9:53 AM  SpO2 98 % 07/30/2018  9:53 AM  Vitals shown include unvalidated device data.  Last Pain:  Vitals:   07/30/18 0918  TempSrc: Tympanic  PainSc: 0-No pain         Complications: No apparent anesthesia complications

## 2018-07-30 NOTE — Anesthesia Postprocedure Evaluation (Signed)
Anesthesia Post Note  Patient: Stacey Santos  Procedure(s) Performed: COLONOSCOPY WITH PROPOFOL (N/A )  Patient location during evaluation: Endoscopy Anesthesia Type: General Level of consciousness: awake and alert Pain management: pain level controlled Vital Signs Assessment: post-procedure vital signs reviewed and stable Respiratory status: spontaneous breathing, nonlabored ventilation, respiratory function stable and patient connected to nasal cannula oxygen Cardiovascular status: blood pressure returned to baseline and stable Postop Assessment: no apparent nausea or vomiting Anesthetic complications: no     Last Vitals:  Vitals:   07/30/18 1010 07/30/18 1016  BP:  106/80  Pulse: 93 82  Resp: 14 19  Temp:    SpO2: 97% 99%    Last Pain:  Vitals:   07/30/18 0918  TempSrc: Tympanic  PainSc: 0-No pain                 Aldous Housel S

## 2018-07-30 NOTE — Anesthesia Preprocedure Evaluation (Signed)
Anesthesia Evaluation  Patient identified by MRN, date of birth, ID band Patient awake    Reviewed: Allergy & Precautions, NPO status , Patient's Chart, lab work & pertinent test results, reviewed documented beta blocker date and time   Airway Mallampati: II  TM Distance: >3 FB     Dental  (+) Chipped   Pulmonary asthma , sleep apnea ,           Cardiovascular      Neuro/Psych  Headaches,    GI/Hepatic   Endo/Other  diabetesMorbid obesity  Renal/GU      Musculoskeletal   Abdominal   Peds  Hematology   Anesthesia Other Findings   Reproductive/Obstetrics                             Anesthesia Physical Anesthesia Plan  ASA: III  Anesthesia Plan: General   Post-op Pain Management:    Induction: Intravenous  PONV Risk Score and Plan:   Airway Management Planned:   Additional Equipment:   Intra-op Plan:   Post-operative Plan:   Informed Consent: I have reviewed the patients History and Physical, chart, labs and discussed the procedure including the risks, benefits and alternatives for the proposed anesthesia with the patient or authorized representative who has indicated his/her understanding and acceptance.     Plan Discussed with: CRNA  Anesthesia Plan Comments:         Anesthesia Quick Evaluation

## 2018-08-02 ENCOUNTER — Encounter: Payer: Self-pay | Admitting: Gastroenterology

## 2018-08-30 ENCOUNTER — Encounter: Payer: Self-pay | Admitting: *Deleted

## 2018-08-31 ENCOUNTER — Ambulatory Visit: Payer: BLUE CROSS/BLUE SHIELD | Admitting: Anesthesiology

## 2018-08-31 ENCOUNTER — Ambulatory Visit
Admission: RE | Admit: 2018-08-31 | Discharge: 2018-08-31 | Disposition: A | Discharge status: Home / Self Care | Payer: BLUE CROSS/BLUE SHIELD | Source: Ambulatory Visit | Attending: Gastroenterology | Admitting: Gastroenterology

## 2018-08-31 ENCOUNTER — Encounter
Admission: RE | Disposition: A | Discharge status: Home / Self Care | Payer: Self-pay | Source: Ambulatory Visit | Attending: Gastroenterology

## 2018-08-31 ENCOUNTER — Encounter: Payer: Self-pay | Admitting: *Deleted

## 2018-08-31 DIAGNOSIS — J45909 Unspecified asthma, uncomplicated: Secondary | ICD-10-CM | POA: Insufficient documentation

## 2018-08-31 DIAGNOSIS — Z7984 Long term (current) use of oral hypoglycemic drugs: Secondary | ICD-10-CM | POA: Insufficient documentation

## 2018-08-31 DIAGNOSIS — I10 Essential (primary) hypertension: Secondary | ICD-10-CM | POA: Insufficient documentation

## 2018-08-31 DIAGNOSIS — Z79899 Other long term (current) drug therapy: Secondary | ICD-10-CM | POA: Diagnosis not present

## 2018-08-31 DIAGNOSIS — G4730 Sleep apnea, unspecified: Secondary | ICD-10-CM | POA: Diagnosis not present

## 2018-08-31 DIAGNOSIS — Z86010 Personal history of colonic polyps: Secondary | ICD-10-CM | POA: Insufficient documentation

## 2018-08-31 DIAGNOSIS — K5730 Diverticulosis of large intestine without perforation or abscess without bleeding: Secondary | ICD-10-CM | POA: Diagnosis not present

## 2018-08-31 DIAGNOSIS — Z86718 Personal history of other venous thrombosis and embolism: Secondary | ICD-10-CM | POA: Insufficient documentation

## 2018-08-31 DIAGNOSIS — K635 Polyp of colon: Secondary | ICD-10-CM | POA: Diagnosis not present

## 2018-08-31 DIAGNOSIS — E039 Hypothyroidism, unspecified: Secondary | ICD-10-CM | POA: Diagnosis not present

## 2018-08-31 DIAGNOSIS — E119 Type 2 diabetes mellitus without complications: Secondary | ICD-10-CM | POA: Diagnosis not present

## 2018-08-31 DIAGNOSIS — K219 Gastro-esophageal reflux disease without esophagitis: Secondary | ICD-10-CM | POA: Diagnosis not present

## 2018-08-31 HISTORY — DX: Benign neoplasm of colon, unspecified: D12.6

## 2018-08-31 HISTORY — DX: Unspecified chronic gastritis without bleeding: K29.50

## 2018-08-31 HISTORY — DX: Benign neoplasm of stomach: D13.1

## 2018-08-31 HISTORY — PX: COLONOSCOPY WITH PROPOFOL: SHX5780

## 2018-08-31 HISTORY — DX: Personal history of other diseases of the digestive system: Z87.19

## 2018-08-31 HISTORY — DX: Ulcer of esophagus without bleeding: K22.10

## 2018-08-31 LAB — GLUCOSE, CAPILLARY: GLUCOSE-CAPILLARY: 128 mg/dL — AB (ref 70–99)

## 2018-08-31 SURGERY — COLONOSCOPY WITH PROPOFOL
Anesthesia: General

## 2018-08-31 MED ORDER — PROPOFOL 500 MG/50ML IV EMUL
INTRAVENOUS | Status: DC | PRN
  Administered 2018-08-31: 100 ug/kg/min via INTRAVENOUS

## 2018-08-31 MED ORDER — PROPOFOL 10 MG/ML IV BOLUS
INTRAVENOUS | Status: AC
  Filled 2018-08-31: qty 20

## 2018-08-31 MED ORDER — PROPOFOL 10 MG/ML IV BOLUS
INTRAVENOUS | Status: DC | PRN
  Administered 2018-08-31: 100 mg via INTRAVENOUS

## 2018-08-31 MED ORDER — LIDOCAINE HCL (CARDIAC) PF 100 MG/5ML IV SOSY
PREFILLED_SYRINGE | INTRAVENOUS | Status: DC | PRN
  Administered 2018-08-31: 20 mg via INTRAVENOUS

## 2018-08-31 MED ORDER — PROPOFOL 500 MG/50ML IV EMUL
INTRAVENOUS | Status: AC
  Filled 2018-08-31: qty 50

## 2018-08-31 MED ORDER — SODIUM CHLORIDE 0.9 % IV SOLN
INTRAVENOUS | Status: DC
  Administered 2018-08-31: 1000 mL via INTRAVENOUS

## 2018-08-31 NOTE — Anesthesia Postprocedure Evaluation (Signed)
Anesthesia Post Note  Patient: Daritza Brees  Procedure(s) Performed: COLONOSCOPY WITH PROPOFOL (N/A )  Patient location during evaluation: Endoscopy Anesthesia Type: General Level of consciousness: awake and alert Pain management: pain level controlled Vital Signs Assessment: post-procedure vital signs reviewed and stable Respiratory status: spontaneous breathing, nonlabored ventilation, respiratory function stable and patient connected to nasal cannula oxygen Cardiovascular status: blood pressure returned to baseline and stable Postop Assessment: no apparent nausea or vomiting Anesthetic complications: no     Last Vitals:  Vitals:   08/31/18 0825 08/31/18 0909  BP: 121/85 109/81  Pulse: 94   Resp: 20   Temp: (!) 35.9 C (!) 36.1 C  SpO2: 99%     Last Pain:  Vitals:   08/31/18 0938  TempSrc:   PainSc: 0-No pain                 Precious Haws Piscitello

## 2018-08-31 NOTE — Op Note (Signed)
[facility] Gastroenterology Patient Name: Stacey Santos Procedure Date: 08/31/2018 8:27 AM MRN: [id number] Account #: [id number] Date of Birth: 1966-06-17 Admit Type: Outpatient Age: 52 Room: [facility] ENDO ROOM 2 Gender: Female Note Status: Finalized Procedure:            Colonoscopy Indications:          Personal history of colonic polyps Providers:            Lollie Sails, MD Referring MD:         Glendon Axe (Referring MD) Medicines:            Monitored Anesthesia Care Complications:        No immediate complications. Procedure:            Pre-Anesthesia Assessment:                       - ASA Grade Assessment: III - A patient with severe                        systemic disease.                       After obtaining informed consent, the colonoscope was                        passed under direct vision. Throughout the procedure,                        the patient's blood pressure, pulse, and oxygen                        saturations were monitored continuously. The                        Colonoscope was introduced through the anus with the                        intention of advancing to the cecum. The scope was                        advanced to the descending colon before the procedure                        was aborted. Medications were given. The colonoscopy                        was extremely difficult due to poor bowel prep with                        stool present. The patient tolerated the procedure well. Findings:      Multiple small-mouthed diverticula were found in the sigmoid colon and       descending colon.      A 3 mm polyp was found in the descending colon. The polyp was sessile.       The polyp was removed with a cold biopsy forceps. Resection and       retrieval were complete.      A moderate amount of liquid semi-solid stool was found in the rectum, in       the sigmoid colon and in the descending colon, with material  adhering to        the lens, precluding visualization. Impression:           - Diverticulosis in the sigmoid colon and in the                        descending colon.                       - One 3 mm polyp in the descending colon, removed with                        a cold biopsy forceps. Resected and retrieved.                       - Stool in the rectum, in the sigmoid colon and in the                        descending colon. Recommendation:       - Discharge patient to home.                       - reprep and reschedule.                       - Await pathology results.                       - Repeat colonoscopy at appointment to be scheduled                        because the bowel preparation was suboptimal. Procedure Code(s):    --- Professional ---                       [id number], 69, Colonoscopy, flexible; with biopsy, single                        or multiple Diagnosis Code(s):    --- Professional ---                       D12.4, Benign neoplasm of descending colon                       Z86.010, Personal history of colonic polyps                       K57.30, Diverticulosis of large intestine without                        perforation or abscess without bleeding CPT copyright 2018 [employer]. All rights reserved. The codes documented in this report are preliminary and upon coder review may  be revised to meet current compliance requirements. Lollie Sails, MD 08/31/2018 9:08:09 AM This report has been signed electronically. Number of Addenda: 0 Note Initiated On: 08/31/2018 8:27 AM Total Procedure Duration: 0 hours 10 minutes 20 seconds       [facility]

## 2018-08-31 NOTE — Transfer of Care (Signed)
Immediate Anesthesia Transfer of Care Note  Patient: Stacey Santos  Procedure(s) Performed: COLONOSCOPY WITH PROPOFOL (N/A )  Patient Location: PACU and Endoscopy Unit  Anesthesia Type:General  Level of Consciousness: awake, alert , oriented and patient cooperative  Airway & Oxygen Therapy: Patient Spontanous Breathing  Post-op Assessment: Report given to RN, Post -op Vital signs reviewed and stable and Patient moving all extremities  Post vital signs: Reviewed and stable  Last Vitals:  Vitals Value Taken Time  BP 109/81 08/31/2018  9:09 AM  Temp 36.1 C 08/31/2018  9:09 AM  Pulse 100 08/31/2018  9:12 AM  Resp 14 08/31/2018  9:12 AM  SpO2 99 % 08/31/2018  9:12 AM  Vitals shown include unvalidated device data.  Last Pain:  Vitals:   08/31/18 0909  TempSrc: Tympanic  PainSc: 0-No pain         Complications: No apparent anesthesia complications

## 2018-08-31 NOTE — H&P (Signed)
Outpatient short stay form Pre-procedure 08/31/2018 8:37 AM Lollie Sails MD  Primary Physician: Dr. Glendon Axe  Reason for visit: Colonoscopy  History of present illness: Patient is a 52 year old female presenting today for colonoscopy.  She has a personal history of adenomatous colon polyps.  She also has a fairly tortuous colon in terms of pathway.  Tolerating her prep well.  She states that she is going clear today.  She takes no aspirin or blood thinning agent.    Current Facility-Administered Medications:  .  0.9 %  sodium chloride infusion, , Intravenous, Continuous, Lollie Sails, MD  Medications Prior to Admission  Medication Sig Dispense Refill Last Dose  . metoprolol succinate (TOPROL-XL) 50 MG 24 hr tablet Take 50 mg by mouth daily. Take with or immediately following a meal.   08/30/2018 at Unknown time  . albuterol (PROVENTIL HFA;VENTOLIN HFA) 108 (90 BASE) MCG/ACT inhaler Inhale into the lungs every 4 (four) hours as needed for wheezing or shortness of breath.      Marland Kitchen amLODipine (NORVASC) 5 MG tablet Take 5 mg by mouth daily.     . DULoxetine (CYMBALTA) 30 MG capsule Take 30 mg by mouth daily.     . hydrochlorothiazide (HYDRODIURIL) 12.5 MG tablet Take 12.5 mg by mouth daily. As needed for swelling of legs     . hydrochlorothiazide (HYDRODIURIL) 12.5 MG tablet Take 12.5 mg by mouth daily.     . lansoprazole (PREVACID) 30 MG capsule Take 30 mg by mouth daily. DR capsule     . levothyroxine (SYNTHROID, LEVOTHROID) 25 MCG tablet Take 25 mcg by mouth daily. Take with a glass of water 30-60 minutes before breakfast     . meloxicam (MOBIC) 15 MG tablet Take 15 mg by mouth daily.     . metFORMIN (GLUCOPHAGE) 500 MG tablet Take by mouth 2 (two) times daily with a meal.     . tiZANidine (ZANAFLEX) 4 MG tablet Take 4 mg by mouth at bedtime. As needed     . traMADol (ULTRAM) 50 MG tablet Take by mouth every 6 (six) hours as needed.        No Known Allergies   Past  Medical History:  Diagnosis Date  . Asthma   . Chronic gastritis   . Diabetes mellitus without complication ([place])   . Erosive esophagitis   . Fundic gland polyps of stomach, benign   . GERD (gastroesophageal reflux disease)   . History of blood clots   . History of hiatal hernia   . Hypertension   . Hypothyroidism   . Migraine   . Sleep apnea   . Tubulovillous adenoma of colon     Review of systems:      Physical Exam    Heart and lungs: Regular rate and rhythm without rub or gallop, lungs are bilaterally clear.    HEENT: Normocephalic atraumatic eyes are anicteric    Other:    Pertinant exam for procedure: Soft nontender nondistended, obese, bowel sounds positive normoactive    Planned proceedures: Colonoscopy and indicated procedures. I have discussed the risks benefits and complications of procedures to include not limited to bleeding, infection, perforation and the risk of sedation and the patient wishes to proceed.    Lollie Sails, MD Gastroenterology 08/31/2018  8:37 AM

## 2018-08-31 NOTE — Anesthesia Post-op Follow-up Note (Signed)
Anesthesia QCDR form completed.        

## 2018-08-31 NOTE — Anesthesia Preprocedure Evaluation (Signed)
Anesthesia Evaluation  Patient identified by MRN, date of birth, ID band Patient awake    Reviewed: Allergy & Precautions, H&P , NPO status , Patient's Chart, lab work & pertinent test results  History of Anesthesia Complications Negative for: history of anesthetic complications  Airway Mallampati: II  TM Distance: >3 FB Neck ROM: full    Dental  (+) Chipped   Pulmonary neg shortness of breath, asthma , sleep apnea ,           Cardiovascular Exercise Tolerance: Good hypertension, (-) angina(-) Past MI and (-) DOE      Neuro/Psych  Headaches, negative psych ROS   GI/Hepatic Neg liver ROS, hiatal hernia, PUD, GERD  Medicated and Controlled,  Endo/Other  diabetes, Type 2Hypothyroidism   Renal/GU negative Renal ROS  negative genitourinary   Musculoskeletal   Abdominal   Peds  Hematology negative hematology ROS (+)   Anesthesia Other Findings Past Medical History: No date: Asthma No date: Chronic gastritis No date: Diabetes mellitus without complication ([facility]) No date: Erosive esophagitis No date: Fundic gland polyps of stomach, benign No date: GERD (gastroesophageal reflux disease) No date: History of blood clots No date: History of hiatal hernia No date: Hypertension No date: Hypothyroidism No date: Migraine No date: Sleep apnea No date: Tubulovillous adenoma of colon  Past Surgical History: No date: CHOLECYSTECTOMY 05/01/2015: COLONOSCOPY WITH PROPOFOL; N/A     Comment:  Procedure: COLONOSCOPY WITH PROPOFOL;  Surgeon: Lollie Sails, MD;  Location: [facility] ENDOSCOPY;  Service:               Endoscopy;  Laterality: N/A; 07/30/2018: COLONOSCOPY WITH PROPOFOL; N/A     Comment:  Procedure: COLONOSCOPY WITH PROPOFOL;  Surgeon:               Lollie Sails, MD;  Location: [facility] ENDOSCOPY;                Service: Endoscopy;  Laterality: N/A; 05/01/2015: ESOPHAGOGASTRODUODENOSCOPY; N/A     Comment:   Procedure: ESOPHAGOGASTRODUODENOSCOPY (EGD);  Surgeon:               Lollie Sails, MD;  Location: [facility] ENDOSCOPY;                Service: Endoscopy;  Laterality: N/A; No date: LAPAROSCOPIC SALPINGO OOPHERECTOMY  BMI    Body Mass Index:  44.81 kg/m      Reproductive/Obstetrics negative OB ROS                             Anesthesia Physical Anesthesia Plan  ASA: III  Anesthesia Plan: General   Post-op Pain Management:    Induction: Intravenous  PONV Risk Score and Plan: Propofol infusion and TIVA  Airway Management Planned: Natural Airway and Nasal Cannula  Additional Equipment:   Intra-op Plan:   Post-operative Plan:   Informed Consent: I have reviewed the patients History and Physical, chart, labs and discussed the procedure including the risks, benefits and alternatives for the proposed anesthesia with the patient or authorized representative who has indicated his/her understanding and acceptance.   Dental Advisory Given  Plan Discussed with: Anesthesiologist, CRNA and Surgeon  Anesthesia Plan Comments: (Patient consented for risks of anesthesia including but not limited to:  - adverse reactions to medications - risk of intubation if required - damage to teeth, lips or other oral mucosa -  sore throat or hoarseness - Damage to heart, brain, lungs or loss of life  Patient voiced understanding.)        Anesthesia Quick Evaluation

## 2018-08-31 NOTE — Anesthesia Postprocedure Evaluation (Signed)
Anesthesia Post Note  Patient: Jaydin Jalomo  Procedure(s) Performed: COLONOSCOPY WITH PROPOFOL (N/A )  Patient location during evaluation: Endoscopy Anesthesia Type: General Level of consciousness: awake and alert, oriented and patient cooperative Pain management: satisfactory to patient Vital Signs Assessment: post-procedure vital signs reviewed and stable Respiratory status: spontaneous breathing and respiratory function stable Cardiovascular status: blood pressure returned to baseline and stable Postop Assessment: no headache, no backache, patient able to bend at knees, no apparent nausea or vomiting, adequate PO intake and able to ambulate Anesthetic complications: no     Last Vitals:  Vitals:   08/31/18 0825 08/31/18 0909  BP: 121/85 109/81  Pulse: 94   Resp: 20   Temp: (!) 35.9 C (!) 36.1 C  SpO2: 99%     Last Pain:  Vitals:   08/31/18 0909  TempSrc: Tympanic  PainSc: 0-No pain                 Precious Haws Farah Benish

## 2018-09-01 ENCOUNTER — Encounter: Payer: Self-pay | Admitting: Gastroenterology

## 2018-09-03 LAB — SURGICAL PATHOLOGY

## 2018-10-04 ENCOUNTER — Encounter: Payer: Self-pay | Admitting: *Deleted

## 2018-10-05 ENCOUNTER — Ambulatory Visit
Admission: RE | Admit: 2018-10-05 | Payer: BLUE CROSS/BLUE SHIELD | Source: Ambulatory Visit | Admitting: Gastroenterology

## 2018-10-05 ENCOUNTER — Encounter: Admission: RE | Payer: Self-pay | Source: Ambulatory Visit

## 2018-10-05 SURGERY — COLONOSCOPY WITH PROPOFOL
Anesthesia: General

## 2018-11-05 ENCOUNTER — Other Ambulatory Visit: Payer: Self-pay | Admitting: Physical Medicine and Rehabilitation

## 2018-11-05 DIAGNOSIS — M5416 Radiculopathy, lumbar region: Secondary | ICD-10-CM

## 2018-11-16 ENCOUNTER — Ambulatory Visit
Admission: RE | Admit: 2018-11-16 | Discharge: 2018-11-16 | Disposition: A | Discharge status: Home / Self Care | Payer: BLUE CROSS/BLUE SHIELD | Source: Ambulatory Visit | Attending: Physical Medicine and Rehabilitation | Admitting: Physical Medicine and Rehabilitation

## 2018-11-16 DIAGNOSIS — M5416 Radiculopathy, lumbar region: Secondary | ICD-10-CM | POA: Diagnosis present

## 2019-10-06 ENCOUNTER — Encounter: Payer: Self-pay | Admitting: Family Medicine

## 2019-10-06 ENCOUNTER — Ambulatory Visit (INDEPENDENT_AMBULATORY_CARE_PROVIDER_SITE_OTHER): Payer: BLUE CROSS/BLUE SHIELD | Admitting: Family Medicine

## 2019-10-06 ENCOUNTER — Other Ambulatory Visit: Payer: Self-pay

## 2019-10-06 VITALS — BP 141/92 | HR 96 | Temp 97.7°F | Ht 62.0 in | Wt 235.0 lb

## 2019-10-06 DIAGNOSIS — E039 Hypothyroidism, unspecified: Secondary | ICD-10-CM

## 2019-10-06 DIAGNOSIS — I10 Essential (primary) hypertension: Secondary | ICD-10-CM | POA: Diagnosis not present

## 2019-10-06 DIAGNOSIS — E118 Type 2 diabetes mellitus with unspecified complications: Secondary | ICD-10-CM

## 2019-10-06 DIAGNOSIS — Z114 Encounter for screening for human immunodeficiency virus [HIV]: Secondary | ICD-10-CM

## 2019-10-06 DIAGNOSIS — G894 Chronic pain syndrome: Secondary | ICD-10-CM | POA: Diagnosis not present

## 2019-10-06 DIAGNOSIS — Z1159 Encounter for screening for other viral diseases: Secondary | ICD-10-CM

## 2019-10-06 DIAGNOSIS — D509 Iron deficiency anemia, unspecified: Secondary | ICD-10-CM

## 2019-10-06 DIAGNOSIS — J4520 Mild intermittent asthma, uncomplicated: Secondary | ICD-10-CM | POA: Diagnosis not present

## 2019-10-06 DIAGNOSIS — Z13220 Encounter for screening for lipoid disorders: Secondary | ICD-10-CM

## 2019-10-06 MED ORDER — DULOXETINE HCL 30 MG PO CPEP: 30.0000 mg | ORAL_CAPSULE | Freq: Two times a day (BID) | ORAL | 1 refills | Status: DC

## 2019-10-06 MED ORDER — CYCLOBENZAPRINE HCL 10 MG PO TABS: 10.0000 mg | ORAL_TABLET | Freq: Every evening | ORAL | 1 refills | Status: DC | PRN

## 2019-10-06 NOTE — Progress Notes (Signed)
Name: Stacey Santos   MRN: [id number]    DOB: [birth year]   Date:10/06/2019       Progress Note  Subjective  Chief Complaint  Chief Complaint  Patient presents with   Establish Care   Neck Pain   Shoulder Pain    I connected with  Manus Gunning  on 10/06/19 at  8:00 AM EST by a video enabled telemedicine application and verified that I am speaking with the correct person using two identifiers.  I discussed the limitations of evaluation and management by telemedicine and the availability of in person appointments. The patient expressed understanding and agreed to proceed. Staff also discussed with the patient that there may be a patient responsible charge related to this service. Patient Location: Home Provider Location: Office Additional Individuals present: None  HPI  Pt new to our office today.  She is a former patient of Dr. Glendon Axe - has not been seen since May by PCP.  Chronic Pain: She has been seeing Dr. Sharlet Salina' office - last visit 09/14/2019.  She has chronic back back, shoulder pain, and neck pain.  She is scheduled for PT - starting today.  She is also awaiting plastic surgery to discuss breast reduction.  She is prescribed Mobic, flexeril, Cymbalta, tramadol, Norco (1 tab daily PRN).  She has been out of flexeril, it did help when she had it - was receiving from prior PCP - we will take over this RX.  Cymbalta was also managed by Dr. Candiss Norse - she is only taking 30mg  daily - she feels her pain is not well controlled and is interested in going up on the dose.  She is aware that I will not be prescribing narcotic pain medication per our office policy.  She will try PT first, but if not improving, we will send to Dr. Holley Raring for second opinion per her request.  Essential hypertension:  Taking medications without noted side effects or dizziness, lightheadedness, chest pain, shortness of breath, or BLE edema. 121/97 at home today.  Taking metoprolol 50mg  XL, amlodipine  5mg , HCTZ 12.5.  She does try to follow low salt diet.  Asthma, mild: Rescue inhaler use is not escalating and no increase in flairs has been noted.  No shortness of breath or nighttime coughing.  Controlled type 2 diabetes mellitus:  Following a diabetic diet.  Denies polyuria, polydipsia, or polyphagia.  Last A1C 03/17/2019 was 7.2%.  Due for repeat labs today.  She was prescribed metformin, but is not taking at this time.  Declines DM education classes at this time; she reports eating only 1 meal a day.  Does not check BG's.    Hypothyroidism, acquired, unspecified:  Energy is doing overall, no hair/skin/nail changes, no CV symptoms.  Last TSH in May 2020 was stable. Taking 5mcg Synthroid and doing well on this.    There are no active problems to display for this patient.   Past Surgical History:  Procedure Laterality Date   CHOLECYSTECTOMY     COLONOSCOPY WITH PROPOFOL N/A 05/01/2015   Procedure: COLONOSCOPY WITH PROPOFOL;  Surgeon: Lollie Sails, MD;  Location: [facility] ENDOSCOPY;  Service: Endoscopy;  Laterality: N/A;   COLONOSCOPY WITH PROPOFOL N/A 07/30/2018   Procedure: COLONOSCOPY WITH PROPOFOL;  Surgeon: Lollie Sails, MD;  Location: [facility] ENDOSCOPY;  Service: Endoscopy;  Laterality: N/A;   COLONOSCOPY WITH PROPOFOL N/A 08/31/2018   Procedure: COLONOSCOPY WITH PROPOFOL;  Surgeon: Lollie Sails, MD;  Location: [facility] ENDOSCOPY;  Service: Endoscopy;  Laterality:  N/A;   ESOPHAGOGASTRODUODENOSCOPY N/A 05/01/2015   Procedure: ESOPHAGOGASTRODUODENOSCOPY (EGD);  Surgeon: Lollie Sails, MD;  Location: [facility] ENDOSCOPY;  Service: Endoscopy;  Laterality: N/A;   LAPAROSCOPIC SALPINGO OOPHERECTOMY      Family History  Problem Relation Age of Onset   Diabetes Mother    Hypertension Mother    Arthritis Mother    Gout Father    Breast cancer Neg Hx     Social History   Socioeconomic History   Marital status: Married    Spouse name: steven   Number of children: 4     Years of education: Not on file   Highest education level: Not on file  Occupational History   Not on file  Social Needs   Financial resource strain: Not hard at all   Food insecurity    Worry: Never true    Inability: Never true   Transportation needs    Medical: No    Non-medical: No  Tobacco Use   Smoking status: Never Smoker   Smokeless tobacco: Never Used  Substance and Sexual Activity   Alcohol use: No   Drug use: No   Sexual activity: Yes    Partners: Male    Birth control/protection: None  Lifestyle   Physical activity    Days per week: 0 days    Minutes per session: 0 min   Stress: Only a little  Relationships   Social connections    Talks on phone: More than three times a week    Gets together: Never    Attends religious service: More than 4 times per year    Active member of club or organization: Yes    Attends meetings of clubs or organizations: More than 4 times per year    Relationship status: Married   Intimate partner violence    Fear of current or ex partner: No    Emotionally abused: No    Physically abused: No    Forced sexual activity: No  Other Topics Concern   Not on file  Social History Narrative   Works at [employer] in [place]     Current Outpatient Medications:    albuterol (PROVENTIL HFA;VENTOLIN HFA) 108 (90 BASE) MCG/ACT inhaler, Inhale into the lungs every 4 (four) hours as needed for wheezing or shortness of breath. , Disp: , Rfl:    amLODipine (NORVASC) 5 MG tablet, Take 5 mg by mouth daily., Disp: , Rfl:    DULoxetine (CYMBALTA) 30 MG capsule, Take 30 mg by mouth daily., Disp: , Rfl:    hydrochlorothiazide (HYDRODIURIL) 12.5 MG tablet, Take 12.5 mg by mouth daily. As needed for swelling of legs, Disp: , Rfl:    lansoprazole (PREVACID) 30 MG capsule, Take 30 mg by mouth daily. DR capsule, Disp: , Rfl:    levothyroxine (SYNTHROID, LEVOTHROID) 25 MCG tablet, Take 25 mcg by mouth daily. Take with a glass  of water 30-60 minutes before breakfast, Disp: , Rfl:    meloxicam (MOBIC) 15 MG tablet, Take 15 mg by mouth daily., Disp: , Rfl:    metFORMIN (GLUCOPHAGE) 500 MG tablet, Take by mouth 2 (two) times daily with a meal., Disp: , Rfl:    metoprolol succinate (TOPROL-XL) 50 MG 24 hr tablet, Take 50 mg by mouth daily. Take with or immediately following a meal., Disp: , Rfl:    traMADol (ULTRAM) 50 MG tablet, Take by mouth every 6 (six) hours as needed., Disp: , Rfl:    hydrochlorothiazide (HYDRODIURIL) 12.5 MG tablet, Take  12.5 mg by mouth daily., Disp: , Rfl:    ondansetron (ZOFRAN) 4 MG tablet, Take 4 mg by mouth every 8 (eight) hours as needed for nausea or vomiting., Disp: , Rfl:    tiZANidine (ZANAFLEX) 4 MG tablet, Take 4 mg by mouth at bedtime. As needed, Disp: , Rfl:   No Known Allergies  I personally reviewed active problem list, medication list, allergies, family history, social history, health maintenance, notes from last encounter, lab results with the patient/caregiver today.   ROS  Ten systems reviewed and is negative except as mentioned in HPI  Objective  Virtual encounter, vitals not obtained.  Body mass index is 42.98 kg/m.  Physical Exam  Constitutional: Patient appears well-developed and well-nourished. No distress.  HENT: Head: Normocephalic and atraumatic.  Neck: Normal range of motion. Pulmonary/Chest: Effort normal. No respiratory distress. Speaking in complete sentences Neurological: Pt is alert and oriented to person, place, and time. Coordination, speech and gait are normal.  Psychiatric: Patient has a normal mood and affect. behavior is normal. Judgment and thought content normal.  No results found for this or any previous visit (from the past 72 hour(s)).  PHQ2/9: Depression screen PHQ 2/9 10/06/2019  Decreased Interest 0  Down, Depressed, Hopeless 0  PHQ - 2 Score 0  Altered sleeping 1  Tired, decreased energy 1  Change in appetite 1  Feeling  bad or failure about yourself  0  Trouble concentrating 0  Moving slowly or fidgety/restless 0  Suicidal thoughts 0  PHQ-9 Score 3  Difficult doing work/chores Somewhat difficult   PHQ-2/9 Result is negative.    Fall Risk: Fall Risk  10/06/2019  Falls in the past year? 0  Number falls in past yr: 0  Injury with Fall? 0  Follow up Falls evaluation completed    Assessment & Plan  1. Chronic pain syndrome - Follow up with pain management; going to PT starting today.  Increase Cymbalta to BID dosing, restart flexeril QHS  2. Essential hypertension - COMPLETE METABOLIC PANEL WITH GFR  3. Mild intermittent asthma, unspecified whether complicated - Doing well on albuterol only  4. Controlled type 2 diabetes mellitus with complication, without long-term current use of insulin (HCC) - Hemoglobin A1c - Microalbumin / creatinine urine ratio  5. Hypothyroidism (acquired) - TSH  6. Iron deficiency anemia, unspecified iron deficiency anemia type - CBC with Differential/Platelet  7. Lipid screening - Lipid panel  8. Encounter for hepatitis C screening test for low risk patient - Hepatitis C antibody  9. Encounter for screening for HIV - HIV Antibody (routine testing w rflx)  I discussed the assessment and treatment plan with the patient. The patient was provided an opportunity to ask questions and all were answered. The patient agreed with the plan and demonstrated an understanding of the instructions.  The patient was advised to call back or seek an in-person evaluation if the symptoms worsen or if the condition fails to improve as anticipated.  I provided 26 minutes of non-face-to-face time during this encounter.

## 2020-02-02 ENCOUNTER — Other Ambulatory Visit: Payer: Self-pay | Admitting: Internal Medicine

## 2020-02-02 DIAGNOSIS — Z1231 Encounter for screening mammogram for malignant neoplasm of breast: Secondary | ICD-10-CM

## 2020-02-06 ENCOUNTER — Ambulatory Visit
Admission: RE | Admit: 2020-02-06 | Discharge: 2020-02-06 | Disposition: A | Discharge status: Home / Self Care | Source: Ambulatory Visit | Attending: Internal Medicine | Admitting: Internal Medicine

## 2020-02-06 DIAGNOSIS — Z1231 Encounter for screening mammogram for malignant neoplasm of breast: Secondary | ICD-10-CM | POA: Insufficient documentation

## 2021-05-09 ENCOUNTER — Other Ambulatory Visit: Payer: Self-pay | Admitting: Internal Medicine

## 2021-05-09 DIAGNOSIS — Z1231 Encounter for screening mammogram for malignant neoplasm of breast: Secondary | ICD-10-CM

## 2021-05-20 ENCOUNTER — Ambulatory Visit
Admission: RE | Admit: 2021-05-20 | Discharge: 2021-05-20 | Disposition: A | Discharge status: Home / Self Care | Payer: BC Managed Care – PPO | Source: Ambulatory Visit | Attending: Internal Medicine | Admitting: Internal Medicine

## 2021-05-20 ENCOUNTER — Other Ambulatory Visit: Payer: Self-pay

## 2021-05-20 DIAGNOSIS — Z1231 Encounter for screening mammogram for malignant neoplasm of breast: Secondary | ICD-10-CM | POA: Diagnosis present

## 2021-07-09 ENCOUNTER — Encounter: Payer: Self-pay | Admitting: Emergency Medicine

## 2021-07-09 ENCOUNTER — Ambulatory Visit (INDEPENDENT_AMBULATORY_CARE_PROVIDER_SITE_OTHER)

## 2021-07-09 ENCOUNTER — Telehealth: Admitting: Physician Assistant

## 2021-07-09 ENCOUNTER — Other Ambulatory Visit: Payer: Self-pay

## 2021-07-09 ENCOUNTER — Ambulatory Visit
Admission: EM | Admit: 2021-07-09 | Discharge: 2021-07-09 | Disposition: A | Discharge status: Home / Self Care | Attending: Family Medicine | Admitting: Family Medicine

## 2021-07-09 DIAGNOSIS — R053 Chronic cough: Secondary | ICD-10-CM

## 2021-07-09 DIAGNOSIS — R059 Cough, unspecified: Secondary | ICD-10-CM

## 2021-07-09 DIAGNOSIS — J4521 Mild intermittent asthma with (acute) exacerbation: Secondary | ICD-10-CM | POA: Diagnosis not present

## 2021-07-09 DIAGNOSIS — Z20822 Contact with and (suspected) exposure to covid-19: Secondary | ICD-10-CM

## 2021-07-09 MED ORDER — AEROCHAMBER PLUS FLO-VU MEDIUM MISC: 1.0000 | Freq: Once | 0 refills | Status: AC

## 2021-07-09 MED ORDER — PREDNISONE 20 MG PO TABS: 40.0000 mg | ORAL_TABLET | Freq: Every day | ORAL | 0 refills | Status: DC

## 2021-07-09 MED ORDER — ALBUTEROL SULFATE HFA 108 (90 BASE) MCG/ACT IN AERS: 2.0000 | INHALATION_SPRAY | RESPIRATORY_TRACT | 0 refills | Status: DC | PRN

## 2021-07-09 MED ORDER — ALBUTEROL SULFATE (2.5 MG/3ML) 0.083% IN NEBU: 2.5000 mg | INHALATION_SOLUTION | Freq: Four times a day (QID) | RESPIRATORY_TRACT | 1 refills | Status: DC | PRN

## 2021-07-09 NOTE — ED Triage Notes (Signed)
Patient has a productive cough, nasal congestion for several months.  Patient does have a history of asthma and thought it was a flare.  Patient is not getting any relief with inhaler.  Requesting a CXR and a COVID test.  Patient is vaccinated for COVID.

## 2021-07-09 NOTE — Patient Instructions (Signed)
1. Cough  - albuterol  2. Mild intermittent asthma with exacerbation  - albuterol  - prednisone  3. Encounter by telehealth for suspected COVID-19  - recommend evaluation by UC for CXR and COVID testing.

## 2021-07-09 NOTE — Progress Notes (Signed)
[name], ryba are scheduled for a virtual visit with your provider today.    Just as we do with appointments in the office, we must obtain your consent to participate.  Your consent will be active for this visit and any virtual visit you may have with one of our providers in the next 365 days.    If you have a MyChart account, I can also send a copy of this consent to you electronically.  All virtual visits are billed to your insurance company just like a traditional visit in the office.  As this is a virtual visit, video technology does not allow for your provider to perform a traditional examination.  This may limit your provider's ability to fully assess your condition.  If your provider identifies any concerns that need to be evaluated in person or the need to arrange testing such as labs, EKG, etc, we will make arrangements to do so.    Although advances in technology are sophisticated, we cannot ensure that it will always work on either your end or our end.  If the connection with a video visit is poor, we may have to switch to a telephone visit.  With either a video or telephone visit, we are not always able to ensure that we have a secure connection.   I need to obtain your verbal consent now.   Are you willing to proceed with your visit today?   Chessica Gillean has provided verbal consent on 07/09/2021 for a virtual visit (video or telephone).   Jarrett Soho Byran Bilotti, PA-C 07/09/2021  9:30 AM   Date:  07/09/2021   ID:  Stacey Santos, DOB January 24, 1966, MRN [id number]  Patient Location: Home Provider Location: Home Office   Participants: Patient and Provider for Visit and Wrap up  Method of visit: Video  Location of Patient: Home Location of Provider: Home Office Consent was obtain for visit over the video. Services rendered by provider: Visit was performed via video  A video enabled telemedicine application was used and I verified that I am speaking with the correct person using two  identifiers.  PCP:  Hubbard Hartshorn, FNP   Chief Complaint:  Cough  History of Present Illness:    Stacey Santos is a 55 y.o. female with history as stated below. Presents video telehealth for an acute care visit  Pt reports cough since Friday night - 4 days ago.  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 She has cough, chills, body aches and nasal congestion.  She has not tested for COVID.  Pt reports she has never been hospitalized for her asthma and it has been many years since she took prednisone. 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  Pt reports the cough prevents her from sleeping, but she is not feeling short of breath.  She attempted to be seen by her PCP but they did not have any appointments.   Modifying factors include: Albuterol MDI without spacer without significant   No other aggravating or relieving factors.  No other c/o.  Past Medical, Surgical, Social History, Allergies, and Medications have been Reviewed.  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 , Disp: , Rfl:    amLODipine (NORVASC) 5 MG tablet, Take 5 mg by mouth daily., Disp: , Rfl:    cyclobenzaprine (FLEXERIL) 10 MG tablet, Take 1 tablet (10 mg total) by mouth at bedtime as needed for muscle spasms., Disp: 90 tablet, Rfl: 1   DULoxetine (CYMBALTA) 30 MG capsule, Take 1 capsule (30 mg total) by mouth 2 (two) times daily., Disp: 180 capsule, Rfl: 1   hydrochlorothiazide (HYDRODIURIL) 12.5 MG tablet, Take 12.5 mg by mouth daily. As needed for swelling of legs, Disp: , Rfl:    lansoprazole (PREVACID) 30 MG capsule, Take 30 mg by mouth daily. DR capsule, Disp: , Rfl:    levothyroxine (SYNTHROID, LEVOTHROID) 25 MCG tablet, Take 25 mcg by mouth daily. Take with a glass of water 30-60 minutes before breakfast, Disp: , Rfl:    meloxicam (MOBIC) 15 MG tablet, Take 15 mg by mouth daily., Disp: , Rfl:    metoprolol succinate (TOPROL-XL) 50 MG 24 hr tablet, Take 50 mg by mouth daily. Take with or immediately following a meal., Disp: , Rfl:    ondansetron (ZOFRAN) 4 MG tablet, Take 4 mg by mouth every 8 (eight) hours as needed for nausea or vomiting., Disp: , Rfl:    traMADol (ULTRAM) 50 MG tablet, Take by mouth every 6 (six) hours as needed., Disp: , Rfl:    No Known Allergies   Review of Systems  Constitutional:  Positive for chills and malaise/fatigue. Negative for fever.  HENT:  Positive for congestion. Negative for ear pain and sore throat.   Eyes:  Negative for blurred vision and double vision.  Respiratory:  Positive for cough and wheezing. Negative for shortness of breath.   Cardiovascular:  Negative for chest pain, palpitations and leg swelling.  Gastrointestinal:  Negative for abdominal pain, diarrhea, nausea and vomiting.  Genitourinary:  Negative for dysuria.  Musculoskeletal:  Positive for myalgias.  Skin:  Negative for rash.  Neurological:  Negative for loss of consciousness, weakness and headaches.   Psychiatric/Behavioral:  The patient is not nervous/anxious.   See HPI for history of present illness.  Physical Exam Constitutional:      General: She is not in acute distress.    Appearance: Normal appearance. She is not ill-appearing.  HENT:     Head: Normocephalic and atraumatic.     Nose: Congestion present.  Eyes:     Extraocular Movements: Extraocular movements intact.  Pulmonary:     Effort: Pulmonary effort is normal.     Comments: Speaks in full sentences Musculoskeletal:        General: Normal range of motion.     Cervical back: Normal range of motion.  Skin:    Coloration: Skin is not pale.  Neurological:     General: No focal deficit present.     Mental Status: She is alert.  Mental status is at baseline.  Psychiatric:        Mood and Affect: Mood normal.              A&P  1. Cough  - albuterol  2. Mild intermittent asthma with exacerbation  - albuterol  - prednisone  3. Encounter by telehealth for suspected COVID-19  - recommend evaluation by UC for CXR and COVID testing.   Patient voiced understanding and agreement to plan.   Time:   Today, I have spent 15 minutes with the patient with telehealth technology discussing the above problems, reviewing the chart, previous notes, medications and orders.    Tests Ordered: No orders of the defined types were placed in this encounter.   Medication Changes: No orders of the defined types were placed in this encounter.    Disposition:  Follow up Urgent Care today  SignedAbigail Butts, PA-C  07/09/2021 9:30 AM

## 2021-07-09 NOTE — Discharge Instructions (Signed)
You have been tested for COVID-19 today. °If your test returns positive, you will receive a phone call from [facility] regarding your results. °Negative test results are not called. °Both positive and negative results area always visible on MyChart. °If you do not have a MyChart account, sign up instructions are provided in your discharge papers. °Please do not hesitate to contact us should you have questions or concerns. ° °

## 2021-07-10 LAB — SARS-COV-2, NAA 2 DAY TAT

## 2021-07-10 LAB — NOVEL CORONAVIRUS, NAA: SARS-CoV-2, NAA: DETECTED — AB

## 2021-07-10 NOTE — ED Provider Notes (Signed)
[place]   [id number] 07/09/21 Arrival Time: [place] PLAN:  1. Persistent cough for 3 weeks or longer    I have personally viewed the imaging studies ordered this visit. No worrisome changes on CXR.  Unclear etiology. COVID-19 testing sent. OTC symptom care as needed.  Recommend:  Follow-up Information     Schedule an appointment as soon as possible for a visit  with Hubbard Hartshorn, FNP.   Specialty: Family Medicine Contact information: [address] [place] [address] [phone]                 Reviewed expectations re: course of current medical issues. Questions answered. Outlined signs and symptoms indicating need for more acute intervention. Understanding verbalized. After Visit Summary given.   SUBJECTIVE: History from: patient. Stacey Santos is a 55 y.o. female who reports productive cough, nasal congestion; x sev months. H/O asthma; questions if he wheezes at times. Inhaler without much relief. No specific GERD symptoms. Denies: fever and difficulty breathing. Normal PO intake without n/v/d.  Social History   Tobacco Use  Smoking Status Never  Smokeless Tobacco Never     OBJECTIVE:  Vitals:   07/09/21 1339 07/09/21 1351  BP: (!) 147/90   Pulse: 87   Resp: 18   Temp: 99 F (37.2 C)   TempSrc: Oral   SpO2: 99%   Weight:  107.5 kg  Height:  '5\' 2"'$  (1.575 m)    General appearance: alert; no distress Eyes: PERRLA; EOMI; conjunctiva normal HENT: [place]; AT; with nasal congestion Neck: supple  Lungs: speaks full sentences without difficulty; unlabored; dry cough; CTAB Extremities: no edema Skin: warm and dry Neurologic: normal gait Psychological: alert and cooperative; normal mood and affect  Labs:  Labs Reviewed  NOVEL CORONAVIRUS, NAA    Imaging: DG Chest 2 View  Result Date: 07/09/2021 CLINICAL DATA:  Persistent cough. EXAM: CHEST - 2 VIEW COMPARISON:  None. FINDINGS: The heart size and  mediastinal contours are within normal limits. Both lungs are clear. The visualized skeletal structures are unremarkable. IMPRESSION: No active cardiopulmonary disease. Electronically Signed   By: Marijo Conception M.D.   On: 07/09/2021 14:43    No Known Allergies  Past Medical History:  Diagnosis Date   Asthma    Chronic gastritis    Diabetes mellitus without complication (HCC)    Erosive esophagitis    Fundic gland polyps of stomach, benign    GERD (gastroesophageal reflux disease)    History of blood clots    History of hiatal hernia    Hypertension    Hypothyroidism    Migraine    Sleep apnea    Tubulovillous adenoma of colon    Social History   Socioeconomic History   Marital status: Married    Spouse name: steven   Number of children: 4   Years of education: Not on file   Highest education level: Not on file  Occupational History   Not on file  Tobacco Use   Smoking status: Never   Smokeless tobacco: Never  Vaping Use   Vaping Use: Never used  Substance and Sexual Activity   Alcohol use: No   Drug use: No   Sexual activity: Yes    Partners: Male    Birth control/protection: None  Other Topics Concern   Not on file  Social History Narrative   Works at [employer] in [place] Strain:  Not on file  Food Insecurity: Not on file  Transportation Needs: Not on file  Physical Activity: Not on file  Stress: Not on file  Social Connections: Not on file  Intimate Partner Violence: Not on file   Family History  Problem Relation Age of Onset   Diabetes Mother    Hypertension Mother    Arthritis Mother    Gout Father    Breast cancer Neg Hx    Past Surgical History:  Procedure Laterality Date   CHOLECYSTECTOMY     COLONOSCOPY WITH PROPOFOL N/A 05/01/2015   Procedure: COLONOSCOPY WITH PROPOFOL;  Surgeon: Lollie Sails, MD;  Location: [facility] ENDOSCOPY;  Service: Endoscopy;  Laterality: N/A;    COLONOSCOPY WITH PROPOFOL N/A 07/30/2018   Procedure: COLONOSCOPY WITH PROPOFOL;  Surgeon: Lollie Sails, MD;  Location: [facility] ENDOSCOPY;  Service: Endoscopy;  Laterality: N/A;   COLONOSCOPY WITH PROPOFOL N/A 08/31/2018   Procedure: COLONOSCOPY WITH PROPOFOL;  Surgeon: Lollie Sails, MD;  Location[facility];  Service: Endoscopy;  Laterality: N/A;   ESOPHAGOGASTRODUODENOSCOPY N/A 05/01/2015   Procedure: ESOPHAGOGASTRODUODENOSCOPY (EGD);  Surgeon: Lollie Sails, MD;  Location: [facility] ENDOSCOPY;  Service: Endoscopy;  Laterality: N/A;   LAPAROSCOPIC SALPINGO Beverely Pace, MD 07/10/21 1549

## 2021-07-19 ENCOUNTER — Ambulatory Visit: Payer: Self-pay

## 2021-07-19 NOTE — Telephone Encounter (Signed)
Patient called and says she was seen in the [facility] on 07/09/21 and was given a prescription for nebulizer solution, but she doesn't have a machine and the pharmacist says whoever ordered the solution will have to order the machine. I advised to contact PCP office to ask about ordering the machine, she verbalized understanding.  Reason for Disposition  Health Information question, no triage required and triager able to answer question  Answer Assessment - Initial Assessment Questions 1. REASON FOR CALL or QUESTION: "What is your reason for calling today?" or "How can I best help you?" or "What question do you have that I can help answer?"     I need a nebulizer machine kit ordered  Protocols used: Information Only Call - No Triage-A-AH

## 2022-04-22 ENCOUNTER — Other Ambulatory Visit: Payer: Self-pay | Admitting: Internal Medicine

## 2022-04-22 DIAGNOSIS — Z1231 Encounter for screening mammogram for malignant neoplasm of breast: Secondary | ICD-10-CM

## 2022-05-22 ENCOUNTER — Encounter

## 2022-06-05 ENCOUNTER — Ambulatory Visit
Admission: RE | Admit: 2022-06-05 | Discharge: 2022-06-05 | Disposition: A | Discharge status: Home / Self Care | Source: Ambulatory Visit | Attending: Internal Medicine | Admitting: Internal Medicine

## 2022-06-05 DIAGNOSIS — Z1231 Encounter for screening mammogram for malignant neoplasm of breast: Secondary | ICD-10-CM | POA: Diagnosis present

## 2022-06-11 ENCOUNTER — Other Ambulatory Visit: Payer: Self-pay | Admitting: Internal Medicine

## 2022-06-11 DIAGNOSIS — R928 Other abnormal and inconclusive findings on diagnostic imaging of breast: Secondary | ICD-10-CM

## 2022-06-11 DIAGNOSIS — R921 Mammographic calcification found on diagnostic imaging of breast: Secondary | ICD-10-CM

## 2022-06-24 ENCOUNTER — Inpatient Hospital Stay: Admission: RE | Admit: 2022-06-24 | Source: Ambulatory Visit

## 2022-07-11 ENCOUNTER — Ambulatory Visit
Admission: RE | Admit: 2022-07-11 | Discharge: 2022-07-11 | Disposition: A | Discharge status: Home / Self Care | Source: Ambulatory Visit | Attending: Internal Medicine | Admitting: Internal Medicine

## 2022-07-11 DIAGNOSIS — R928 Other abnormal and inconclusive findings on diagnostic imaging of breast: Secondary | ICD-10-CM

## 2022-07-11 DIAGNOSIS — R921 Mammographic calcification found on diagnostic imaging of breast: Secondary | ICD-10-CM | POA: Insufficient documentation

## 2022-07-15 ENCOUNTER — Other Ambulatory Visit: Payer: Self-pay | Admitting: Internal Medicine

## 2022-07-15 DIAGNOSIS — R921 Mammographic calcification found on diagnostic imaging of breast: Secondary | ICD-10-CM

## 2022-07-15 DIAGNOSIS — R928 Other abnormal and inconclusive findings on diagnostic imaging of breast: Secondary | ICD-10-CM

## 2022-07-18 ENCOUNTER — Encounter: Payer: Self-pay | Admitting: Plastic Surgery

## 2022-07-18 ENCOUNTER — Ambulatory Visit (INDEPENDENT_AMBULATORY_CARE_PROVIDER_SITE_OTHER): Admitting: Plastic Surgery

## 2022-07-18 VITALS — BP 131/88 | HR 97 | Ht 62.0 in | Wt 248.6 lb

## 2022-07-18 DIAGNOSIS — R21 Rash and other nonspecific skin eruption: Secondary | ICD-10-CM

## 2022-07-18 DIAGNOSIS — G8929 Other chronic pain: Secondary | ICD-10-CM

## 2022-07-18 DIAGNOSIS — M545 Low back pain, unspecified: Secondary | ICD-10-CM

## 2022-07-18 DIAGNOSIS — N62 Hypertrophy of breast: Secondary | ICD-10-CM | POA: Insufficient documentation

## 2022-07-18 DIAGNOSIS — M546 Pain in thoracic spine: Secondary | ICD-10-CM

## 2022-07-18 DIAGNOSIS — M542 Cervicalgia: Secondary | ICD-10-CM | POA: Diagnosis not present

## 2022-07-18 DIAGNOSIS — Z6841 Body Mass Index (BMI) 40.0 and over, adult: Secondary | ICD-10-CM

## 2022-07-18 NOTE — Progress Notes (Signed)
Patient ID: Stacey Santos, female    DOB: 09/20/1966, 56 y.o.   MRN: [id number]   Chief Complaint  Patient presents with   Consult   Breast Problem    Mammary Hyperplasia: The patient is a 56 y.o. female with a history of mammary hyperplasia for several years.  She has extremely large breasts causing symptoms that include the following: Back pain in the upper and lower back, including neck pain. She pulls or pins her bra straps to provide better lift and relief of the pressure and pain. She notices relief by holding her breast up manually.  Her shoulder straps cause grooves and pain and pressure that requires padding for relief. Pain medication is sometimes required with motrin and tylenol.  Activities that are hindered by enlarged breasts include: exercise and running.  She has tried supportive clothing as well as fitted bras without improvement.  Her breasts are extremely large and fairly symmetric with the right slightly longer.  She has hyperpigmentation of the inframammary area on both sides.  The sternal to nipple distance on the right is 45 cm and the left is 45 cm.  The IMF distance is 26 cm.  She is 5 feet 2 inches tall and weighs 248 pounds.  The BMI = 45.2 kg/m.  Preoperative bra size = H cup. She would like to be a B/C cup.  The estimated excess breast tissue to be removed at the time of surgery = 850 grams on the left and 850 grams on the right.  Mammogram history: needs follow mammogram. Had irregularity 07/11/22.  Family history of breast cancer:  no.  Tobacco use:  no.  Boreline DM.   The patient expresses the desire to pursue surgical intervention.     Review of Systems  Constitutional:  Positive for activity change. Negative for appetite change.  Eyes: Negative.   Respiratory: Negative.  Negative for chest tightness and shortness of breath.   Cardiovascular: Negative.   Gastrointestinal: Negative.  Negative for abdominal pain.  Endocrine: Negative.   Genitourinary:  Negative.   Musculoskeletal:  Positive for back pain and neck pain.  Skin:  Positive for rash.  Neurological: Negative.     Past Medical History:  Diagnosis Date   Asthma    Chronic gastritis    Diabetes mellitus without complication (HCC)    Erosive esophagitis    Fundic gland polyps of stomach, benign    GERD (gastroesophageal reflux disease)    History of blood clots    History of hiatal hernia    Hypertension    Hypothyroidism    Migraine    Sleep apnea    Tubulovillous adenoma of colon     Past Surgical History:  Procedure Laterality Date   CHOLECYSTECTOMY     COLONOSCOPY WITH PROPOFOL N/A 05/01/2015   Procedure: COLONOSCOPY WITH PROPOFOL;  Surgeon: Lollie Sails, MD;  Location: [facility] ENDOSCOPY;  Service: Endoscopy;  Laterality: N/A;   COLONOSCOPY WITH PROPOFOL N/A 07/30/2018   Procedure: COLONOSCOPY WITH PROPOFOL;  Surgeon: Lollie Sails, MD;  Location: [facility] ENDOSCOPY;  Service: Endoscopy;  Laterality: N/A;   COLONOSCOPY WITH PROPOFOL N/A 08/31/2018   Procedure: COLONOSCOPY WITH PROPOFOL;  Surgeon: Lollie Sails, MD;  Location: [facility] ENDOSCOPY;  Service: Endoscopy;  Laterality: N/A;   ESOPHAGOGASTRODUODENOSCOPY N/A 05/01/2015   Procedure: ESOPHAGOGASTRODUODENOSCOPY (EGD);  Surgeon: Lollie Sails, MD;  Location: [facility] ENDOSCOPY;  Service: Endoscopy;  Laterality: N/A;   LAPAROSCOPIC SALPINGO OOPHERECTOMY  Current Outpatient Medications:    albuterol (PROVENTIL HFA;VENTOLIN HFA) 108 (90 BASE) MCG/ACT inhaler, Inhale into the lungs every 4 (four) hours as needed for wheezing or shortness of breath. , Disp: , Rfl:    albuterol (PROVENTIL) (2.5 MG/3ML) 0.083% nebulizer solution, Take 3 mLs (2.5 mg total) by nebulization every 6 (six) hours as needed for wheezing or shortness of breath., Disp: 150 mL, Rfl: 1   albuterol (VENTOLIN HFA) 108 (90 Base) MCG/ACT inhaler, Inhale 2 puffs into the lungs every 2 (two) hours as needed for wheezing or shortness of breath  (cough)., Disp: 8 g, Rfl: 0   amLODipine (NORVASC) 5 MG tablet, Take 5 mg by mouth daily., Disp: , Rfl:    cyclobenzaprine (FLEXERIL) 10 MG tablet, Take 1 tablet (10 mg total) by mouth at bedtime as needed for muscle spasms., Disp: 90 tablet, Rfl: 1   hydrochlorothiazide (HYDRODIURIL) 12.5 MG tablet, Take 12.5 mg by mouth daily. As needed for swelling of legs, Disp: , Rfl:    lansoprazole (PREVACID) 30 MG capsule, Take 30 mg by mouth daily. DR capsule, Disp: , Rfl:    levothyroxine (SYNTHROID, LEVOTHROID) 25 MCG tablet, Take 25 mcg by mouth daily. Take with a glass of water 30-60 minutes before breakfast, Disp: , Rfl:    meloxicam (MOBIC) 15 MG tablet, Take 15 mg by mouth daily., Disp: , Rfl:    metoprolol succinate (TOPROL-XL) 50 MG 24 hr tablet, Take 50 mg by mouth daily. Take with or immediately following a meal., Disp: , Rfl:    predniSONE (DELTASONE) 20 MG tablet, Take 2 tablets (40 mg total) by mouth daily., Disp: 10 tablet, Rfl: 0   traMADol (ULTRAM) 50 MG tablet, Take by mouth every 6 (six) hours as needed., Disp: , Rfl:    DULoxetine (CYMBALTA) 30 MG capsule, Take 1 capsule (30 mg total) by mouth 2 (two) times daily. (Patient not taking: Reported on 07/18/2022), Disp: 180 capsule, Rfl: 1   ondansetron (ZOFRAN) 4 MG tablet, Take 4 mg by mouth every 8 (eight) hours as needed for nausea or vomiting. (Patient not taking: Reported on 07/18/2022), Disp: , Rfl:    Objective:   Vitals:   07/18/22 0928  BP: 131/88  Pulse: 97  SpO2: 99%    Physical Exam Vitals reviewed.  Constitutional:      Appearance: Normal appearance.  HENT:     Head: Normocephalic and atraumatic.  Cardiovascular:     Rate and Rhythm: Normal rate.     Pulses: Normal pulses.  Pulmonary:     Effort: Pulmonary effort is normal.  Abdominal:     General: There is no distension.     Palpations: Abdomen is soft.  Musculoskeletal:        General: No swelling or deformity.  Skin:    General: Skin is warm.      Capillary Refill: Capillary refill takes less than 2 seconds.     Coloration: Skin is not jaundiced.     Findings: No bruising.  Neurological:     Mental Status: She is alert and oriented to person, place, and time.  Psychiatric:        Mood and Affect: Mood normal.        Behavior: Behavior normal.        Thought Content: Thought content normal.        Judgment: Judgment normal.     Assessment & Plan:  Chronic bilateral low back pain without sciatica  Symptomatic mammary hypertrophy  Neck pain  The procedure the patient selected and that was best for the patient was discussed. The risk were discussed and include but not limited to the following:  Breast asymmetry, fluid accumulation, firmness of the breast, inability to breast feed, loss of nipple or areola, skin loss, change in skin and nipple sensation, fat necrosis of the breast tissue, bleeding, infection and healing delay.  There are risks of anesthesia and injury to nerves or blood vessels.  Allergic reaction to tape, suture and skin glue are possible.  There will be swelling.  Any of these can lead to the need for revisional surgery.  A breast reduction has potential to interfere with diagnostic procedures in the future.  This procedure is best done when the breast is fully developed.  Changes in the breast will continue to occur over time: pregnancy, weight gain or weigh loss.    Total time: 40 minutes. This includes time spent with the patient during the visit as well as time spent before and after the visit reviewing the chart, documenting the encounter, ordering pertinent studies and literature for the patient.    Mammogram:  pending  [facility]:  referral made  Patient is a candidate for bilateral breast reduction with possible liposuction.  She definitely needs to have her weight headed in the correct direction with decreasing her BMI.  We will move ahead with preauthorization but she will need to decrease  the weight and get the follow-up mammogram.  She will likely need amputation technique.  She is aware.  Pictures were obtained of the patient and placed in the chart with the patient's or guardian's permission.   [place], DO

## 2022-07-18 NOTE — Addendum Note (Signed)
Addended by: Harl Bowie on: 07/18/2022 03:35 PM   Modules accepted: Orders

## 2022-07-24 ENCOUNTER — Ambulatory Visit
Admission: RE | Admit: 2022-07-24 | Discharge: 2022-07-24 | Disposition: A | Discharge status: Home / Self Care | Source: Ambulatory Visit | Attending: Internal Medicine | Admitting: Internal Medicine

## 2022-07-24 DIAGNOSIS — R928 Other abnormal and inconclusive findings on diagnostic imaging of breast: Secondary | ICD-10-CM | POA: Insufficient documentation

## 2022-07-24 DIAGNOSIS — R921 Mammographic calcification found on diagnostic imaging of breast: Secondary | ICD-10-CM | POA: Insufficient documentation

## 2022-07-24 HISTORY — PX: BREAST BIOPSY: SHX20

## 2022-07-25 LAB — SURGICAL PATHOLOGY

## 2022-07-29 ENCOUNTER — Telehealth: Admitting: Plastic Surgery

## 2022-09-01 ENCOUNTER — Encounter (INDEPENDENT_AMBULATORY_CARE_PROVIDER_SITE_OTHER): Payer: Self-pay

## 2023-05-13 ENCOUNTER — Ambulatory Visit (INDEPENDENT_AMBULATORY_CARE_PROVIDER_SITE_OTHER): Admitting: Plastic Surgery

## 2023-05-13 ENCOUNTER — Encounter: Payer: Self-pay | Admitting: Plastic Surgery

## 2023-05-13 VITALS — BP 134/87 | HR 91

## 2023-05-13 DIAGNOSIS — M542 Cervicalgia: Secondary | ICD-10-CM

## 2023-05-13 DIAGNOSIS — M25512 Pain in left shoulder: Secondary | ICD-10-CM

## 2023-05-13 DIAGNOSIS — G8929 Other chronic pain: Secondary | ICD-10-CM

## 2023-05-13 DIAGNOSIS — M25511 Pain in right shoulder: Secondary | ICD-10-CM

## 2023-05-13 DIAGNOSIS — M545 Low back pain, unspecified: Secondary | ICD-10-CM

## 2023-05-13 DIAGNOSIS — Z6841 Body Mass Index (BMI) 40.0 and over, adult: Secondary | ICD-10-CM

## 2023-05-13 DIAGNOSIS — N62 Hypertrophy of breast: Secondary | ICD-10-CM | POA: Diagnosis not present

## 2023-05-13 NOTE — Progress Notes (Signed)
   Subjective:    Patient ID: Stacey Santos, female    DOB: 1966/03/19, 57 y.o.   MRN: [id number]  The patient is a 57 year old female here for follow-up on her breasts.  She was seen in September 2023.  She complains of extremely large breasts with pain in her shoulders and back area.  She has pain and pulling of her shoulders from the bra straps which often causes skin irritation.  She has tried padding and different things for relief.  It is difficult to exercise and run due to the heavy breasts.  Her breasts are fairly symmetric but the right is slightly longer than the left.  She has hyperpigmentation at the inframammary folds.  Her sternal to nipple distance on the right and left is 45 cm.  The inframammary fold distance is 26 cm.  She is 5 feet 2 inches tall and weighs 248 pounds with a BMI of 45.2 kg/m.  The estimated amount for removal is 900 g from each breast.  The patient has lost about 5 pounds.  There is no change in her physical exam.  She had a mammogram in late September 2023 and it was negative.    Review of Systems  Constitutional:  Positive for activity change. Negative for appetite change.  Eyes: Negative.   Respiratory: Negative.  Negative for chest tightness and shortness of breath.   Cardiovascular: Negative.   Gastrointestinal: Negative.   Endocrine: Negative.   Genitourinary: Negative.   Musculoskeletal: Negative.   Hematological: Negative.   Psychiatric/Behavioral: Negative.         Objective:   Physical Exam Vitals reviewed.  Constitutional:      Appearance: Normal appearance.  HENT:     Head: Normocephalic and atraumatic.  Cardiovascular:     Rate and Rhythm: Normal rate.     Pulses: Normal pulses.  Pulmonary:     Effort: Pulmonary effort is normal.  Abdominal:     Palpations: Abdomen is soft.  Musculoskeletal:        General: No swelling, tenderness or deformity.  Skin:    General: Skin is warm.     Capillary Refill: Capillary refill takes less  than 2 seconds.     Coloration: Skin is not jaundiced.     Findings: No bruising.  Neurological:     Mental Status: She is alert and oriented to person, place, and time.  Psychiatric:        Mood and Affect: Mood normal.        Behavior: Behavior normal.        Thought Content: Thought content normal.        Judgment: Judgment normal.           Assessment & Plan:     ICD-10-CM   1. Symptomatic mammary hypertrophy  N62     2. Neck pain  M54.2     3. Chronic bilateral low back pain without sciatica  M54.50    G89.29        Patient is a candidate for bilateral breast reduction with liposuction.  She will need an amputation technique.  This will alter her nipple areola sensation.  In the meantime I want her to decrease her carbs in her sugars and increase her protein so that she can heal.

## 2023-05-14 ENCOUNTER — Telehealth: Payer: Self-pay | Admitting: *Deleted

## 2023-05-14 NOTE — Telephone Encounter (Signed)
Faxed order,demographics,insurance information,and recent office notes to [facility].  Confirmation received and copy scanned into the chart.//AB/CMA 

## 2023-05-28 IMAGING — DX DG CHEST 2V
2 series · 2 of 2 positions shown · non-contrast
Comparison: None.

CLINICAL DATA: Persistent cough.

EXAM:
CHEST - 2 VIEW

[chest pa]
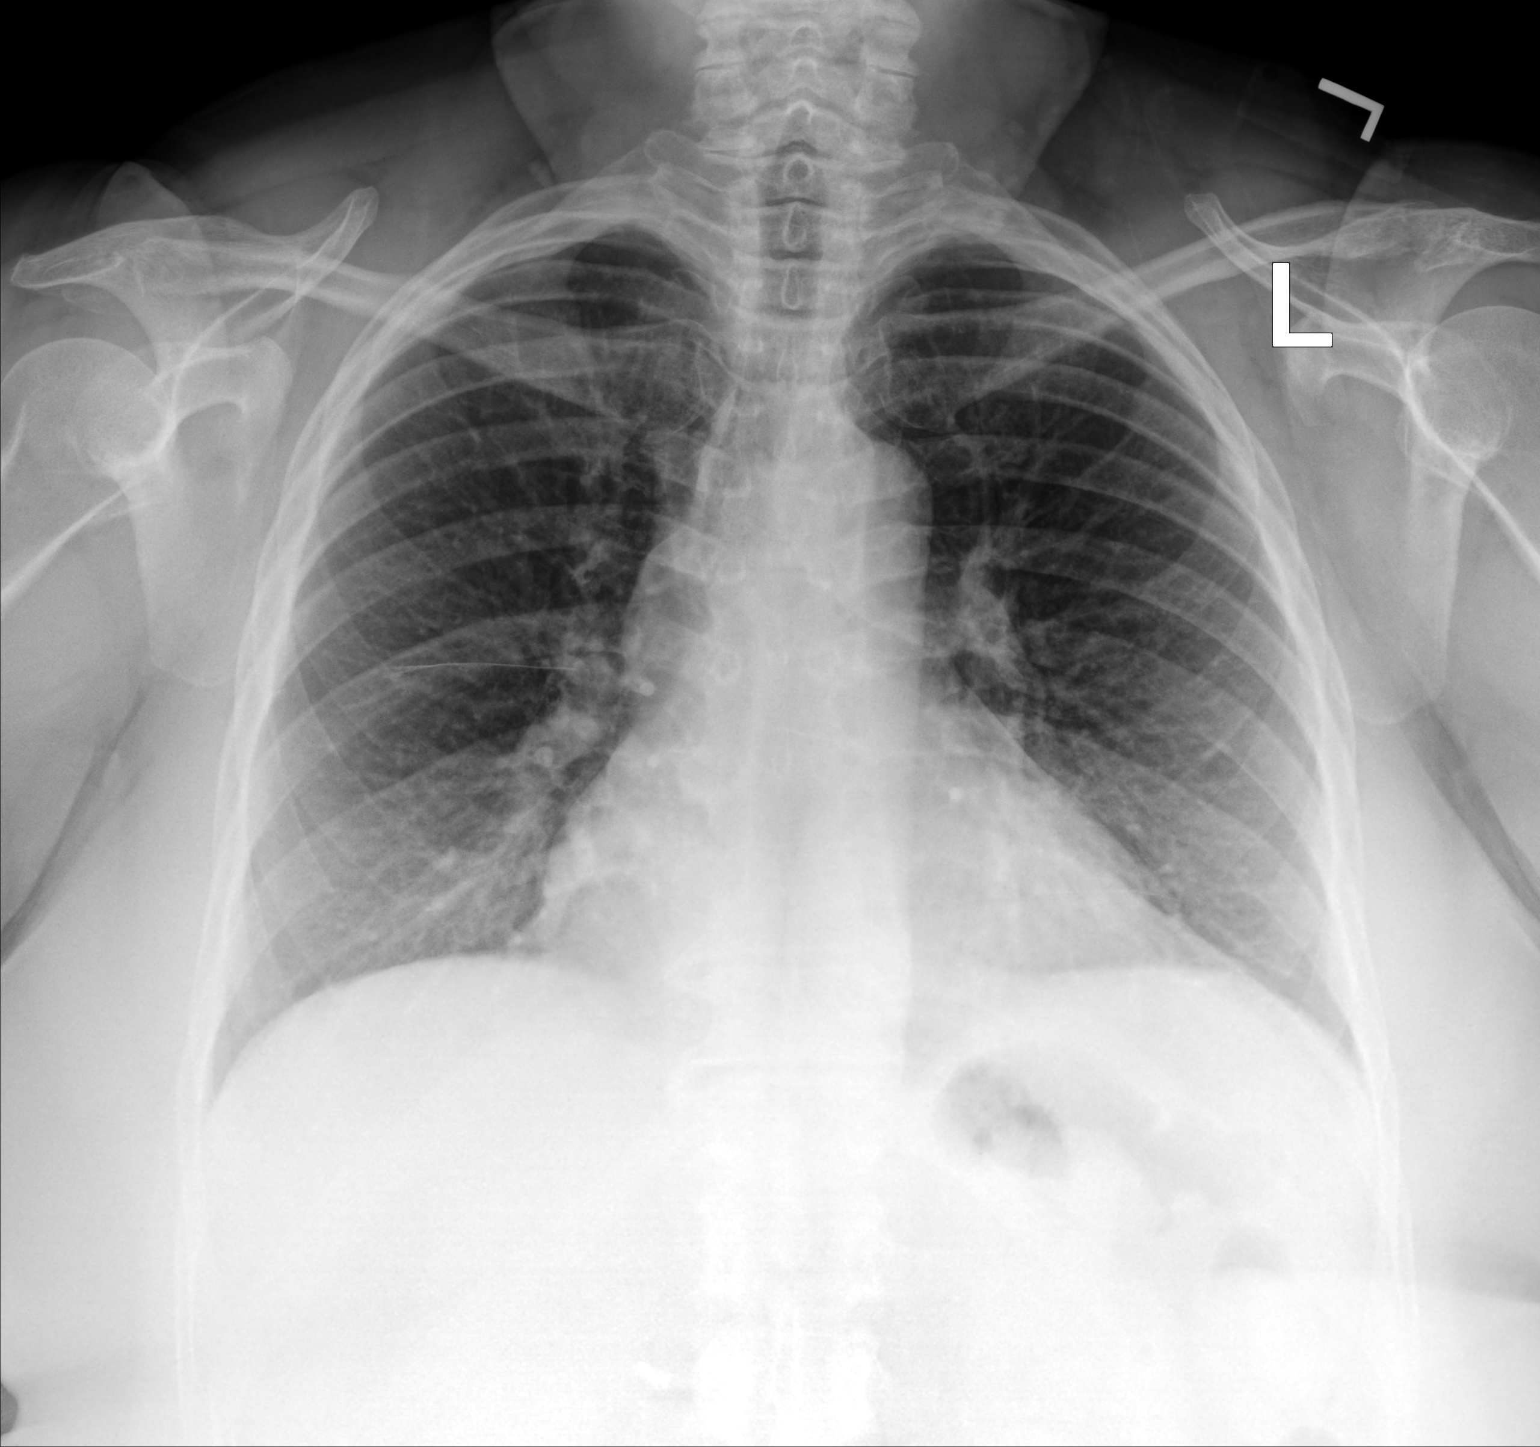

[chest lat]
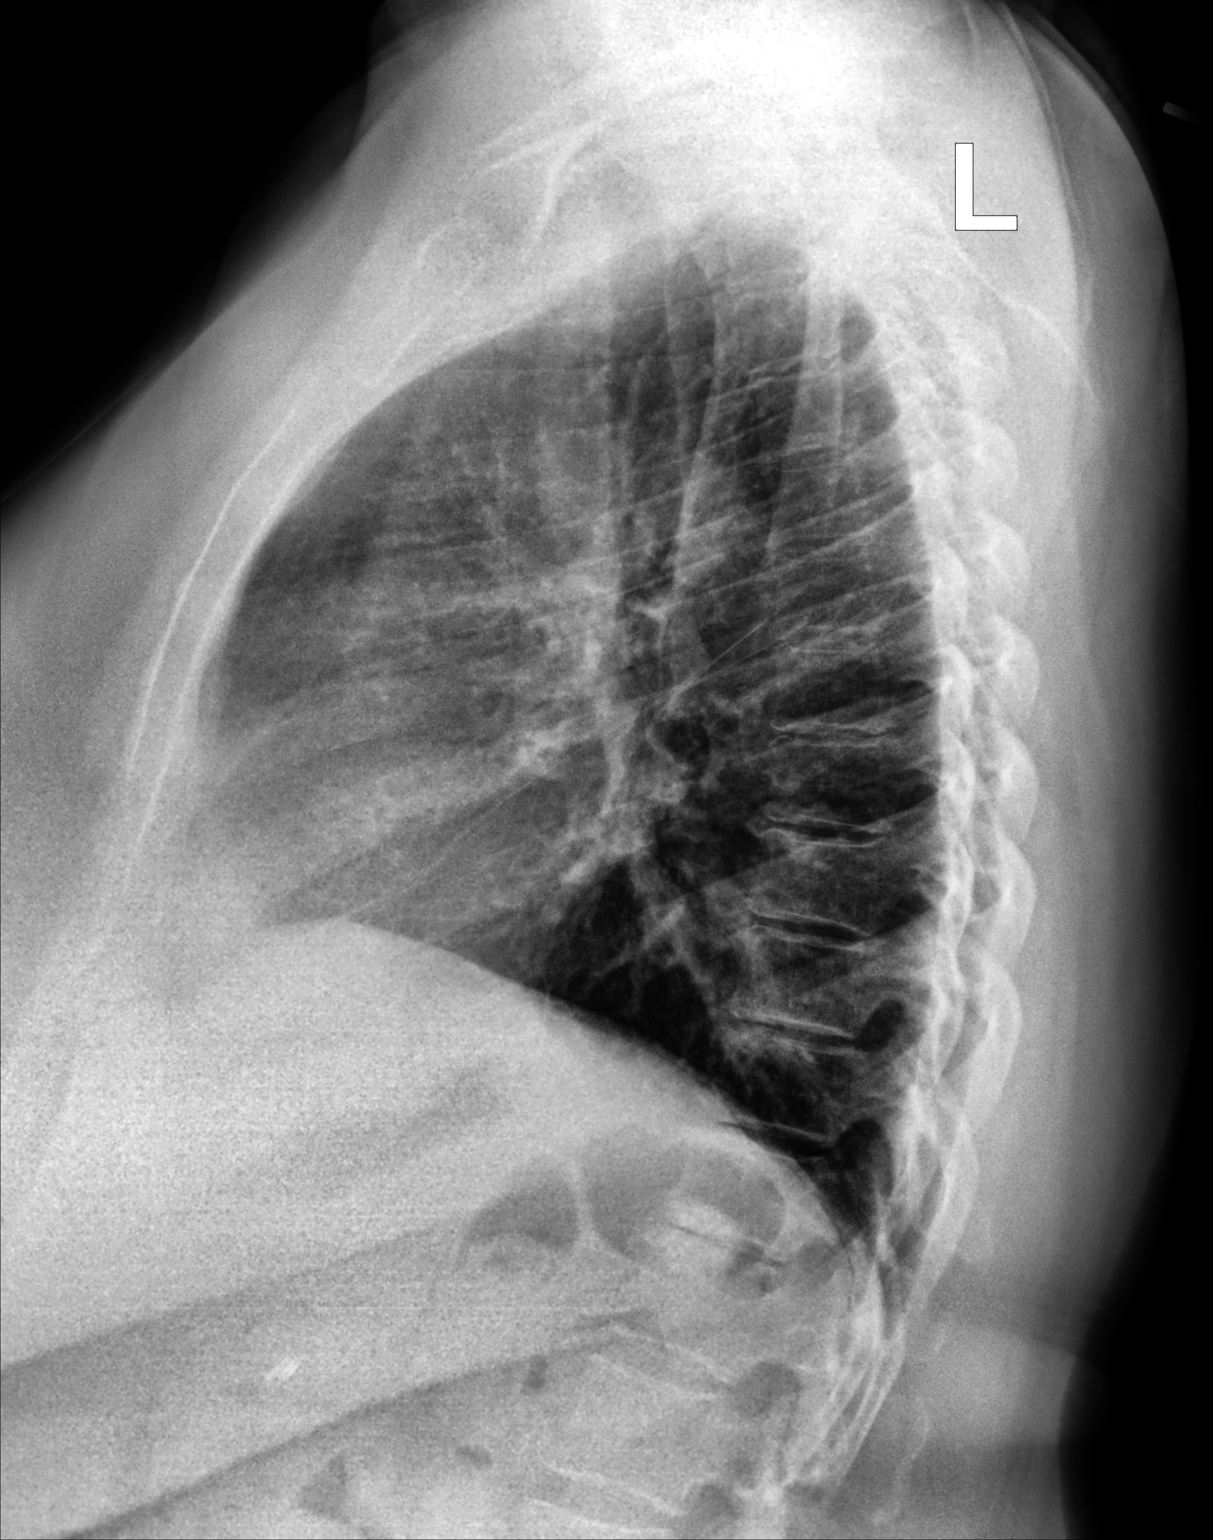

[2 of 2 positions shown; findings below may reference images not displayed]

FINDINGS: The heart size and mediastinal contours are within normal limits.
Both lungs are clear. The visualized skeletal structures are
unremarkable.
IMPRESSION: No active cardiopulmonary disease.

## 2023-10-30 ENCOUNTER — Other Ambulatory Visit: Payer: Self-pay | Admitting: Internal Medicine

## 2023-10-30 DIAGNOSIS — Z1231 Encounter for screening mammogram for malignant neoplasm of breast: Secondary | ICD-10-CM

## 2023-11-02 ENCOUNTER — Ambulatory Visit
Admission: RE | Admit: 2023-11-02 | Discharge: 2023-11-02 | Disposition: A | Discharge status: Home / Self Care | Source: Ambulatory Visit | Attending: Internal Medicine | Admitting: Internal Medicine

## 2023-11-02 ENCOUNTER — Telehealth: Payer: Self-pay | Admitting: Plastic Surgery

## 2023-11-02 DIAGNOSIS — Z1231 Encounter for screening mammogram for malignant neoplasm of breast: Secondary | ICD-10-CM | POA: Diagnosis present

## 2023-11-02 NOTE — Telephone Encounter (Signed)
Pt was seen in July 2024 for consult, there is no order for sx, pt wants to know if she will have an order for sx? Please advise

## 2023-11-05 ENCOUNTER — Telehealth: Payer: Self-pay | Admitting: Plastic Surgery

## 2023-11-05 NOTE — Telephone Encounter (Signed)
 Pt is calling again inquiring about moving forward with surgery, there is no referral in system about moving forward, please advise.

## 2024-01-28 ENCOUNTER — Telehealth: Payer: Self-pay | Admitting: Plastic Surgery

## 2024-01-28 NOTE — Telephone Encounter (Signed)
 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 Thank you :)

## 2024-02-08 ENCOUNTER — Other Ambulatory Visit: Payer: Self-pay | Admitting: Internal Medicine

## 2024-02-08 DIAGNOSIS — R29818 Other symptoms and signs involving the nervous system: Secondary | ICD-10-CM

## 2024-02-12 ENCOUNTER — Ambulatory Visit
Admission: RE | Admit: 2024-02-12 | Discharge: 2024-02-12 | Disposition: A | Discharge status: Home / Self Care | Source: Ambulatory Visit | Attending: Internal Medicine | Admitting: Internal Medicine

## 2024-02-12 DIAGNOSIS — R519 Headache, unspecified: Secondary | ICD-10-CM | POA: Insufficient documentation

## 2024-02-12 DIAGNOSIS — R29818 Other symptoms and signs involving the nervous system: Secondary | ICD-10-CM | POA: Diagnosis present

## 2024-02-12 MED ORDER — GADOBUTROL 1 MMOL/ML IV SOLN
10.0000 mL | Freq: Once | INTRAVENOUS | Status: AC | PRN
  Administered 2024-02-12: 10 mL via INTRAVENOUS

## 2024-02-18 ENCOUNTER — Ambulatory Visit (INDEPENDENT_AMBULATORY_CARE_PROVIDER_SITE_OTHER): Admitting: Student

## 2024-02-18 ENCOUNTER — Encounter: Payer: Self-pay | Admitting: Student

## 2024-02-18 VITALS — BP 116/78 | HR 83 | Ht 62.0 in | Wt 238.6 lb

## 2024-02-18 DIAGNOSIS — N62 Hypertrophy of breast: Secondary | ICD-10-CM

## 2024-02-18 MED ORDER — CEPHALEXIN 500 MG PO CAPS: 500.0000 mg | ORAL_CAPSULE | Freq: Four times a day (QID) | ORAL | 0 refills | Status: AC

## 2024-02-18 MED ORDER — ONDANSETRON HCL 4 MG PO TABS: 4.0000 mg | ORAL_TABLET | Freq: Three times a day (TID) | ORAL | 0 refills | Status: AC | PRN

## 2024-02-18 NOTE — Progress Notes (Signed)
 Patient ID: Stacey Santos, female    DOB: 1966/10/27, 58 y.o.   MRN: [id number]  Chief Complaint  Patient presents with   Pre-op Exam      ICD-10-CM   1. Symptomatic mammary hypertrophy  N62        History of Present Illness: Stacey Santos is a 58 y.o.  female  with a history of macromastia.  She presents for preoperative evaluation for upcoming procedure, Bilateral Breast Reduction wit liposuction, scheduled for 03/09/2024 with Dr.  Ulice Bold  The patient has not had problems with anesthesia.  Patient denies any personal or family history of breast cancer.  She denies any history of cardiac issues.  She states that she is not taking blood thinners at this time.  She reports she is not a smoker.  Patient denies taking any birth control or hormone replacement.  She denies any history of miscarriages.  Patient does states she had a DVT and PE about 22 to 23 years ago after she broke her right ankle.  She states that she was on blood thinners for 6 months, but has not been on any blood thinner since then.  She denies any issues since then.  She denies any family history of blood clots.  She denies any personal or family history of clotting diseases.  She denies any recent traumas, surgeries or infections.  She denies any history of stroke or heart attack.  She denies any history of Crohn's disease or ulcerative colitis.  She denies any history of COPD.  She does reports she has asthma, which is well-controlled.  She denies any history of cancer.  She denies any varicosities to her lower extremities.  She denies any recent fevers, chills or changes in her health.  Patient reports she is currently a G/H cup.  Patient states she would like to be as small as possible.  Discussed with patient that cup size cannot be guaranteed.  Patient expressed understanding.  Summary of Previous Visit: Patient was seen for consult by Dr. Ulice Bold on 05/13/2023.  At this visit, patient complained of  extremely large breasts causing back and shoulder pain.  Her STN bilaterally was 45 cm.  Her BMI was 45.2 kg/m.  The estimated amount for removal was 900 g from each breast.  Patient was found to be a candidate for bilateral breast reduction with liposuction.  It was noted that patient will need an amputation technique.  Estimated excess breast tissue to be removed at time of surgery: 900 grams  Job: Works as a Runner, broadcasting/film/video, planning to take 2 weeks off  PMH Significant for: Hypertension, asthma, GERD, hypothyroidism, chronic pain syndrome  Per chart review, patient's most recent hemoglobin A1c was 6.8 on 11/05/2023.  Patient states that she has never been diagnosed with diabetes before and gets her A1c checked annually.  She states that she has been borderline diabetic.  She states that she was placed on metformin in the past for a few months, but no longer takes metformin or any sort of diabetic medications.  Patient also sees pain management for chronic hip and back pain.  She states that she takes tramadol at home.   Past Medical History: Allergies: No Known Allergies  Current Medications:  Current Outpatient Medications:    albuterol (PROVENTIL HFA;VENTOLIN HFA) 108 (90 BASE) MCG/ACT inhaler, Inhale into the lungs every 4 (four) hours as needed for wheezing or shortness of breath. , Disp: , Rfl:    albuterol (PROVENTIL) (2.5 MG/3ML) 0.083% nebulizer  solution, Take 3 mLs (2.5 mg total) by nebulization every 6 (six) hours as needed for wheezing or shortness of breath., Disp: 150 mL, Rfl: 1   albuterol (VENTOLIN HFA) 108 (90 Base) MCG/ACT inhaler, Inhale 2 puffs into the lungs every 2 (two) hours as needed for wheezing or shortness of breath (cough)., Disp: 8 g, Rfl: 0   hydrochlorothiazide (HYDRODIURIL) 12.5 MG tablet, Take 12.5 mg by mouth daily. As needed for swelling of legs, Disp: , Rfl:    levothyroxine (SYNTHROID, LEVOTHROID) 25 MCG tablet, Take 25 mcg by mouth daily. Take with a glass of  water 30-60 minutes before breakfast, Disp: , Rfl:    meloxicam (MOBIC) 15 MG tablet, Take 15 mg by mouth daily., Disp: , Rfl:    traMADol (ULTRAM) 50 MG tablet, Take by mouth every 6 (six) hours as needed., Disp: , Rfl:    amLODipine (NORVASC) 5 MG tablet, Take 5 mg by mouth daily., Disp: , Rfl:    cyclobenzaprine (FLEXERIL) 10 MG tablet, Take 1 tablet (10 mg total) by mouth at bedtime as needed for muscle spasms., Disp: 90 tablet, Rfl: 1   DULoxetine (CYMBALTA) 30 MG capsule, Take 1 capsule (30 mg total) by mouth 2 (two) times daily. (Patient not taking: Reported on 07/18/2022), Disp: 180 capsule, Rfl: 1   lansoprazole (PREVACID) 30 MG capsule, Take 30 mg by mouth daily. DR capsule, Disp: , Rfl:    metoprolol succinate (TOPROL-XL) 50 MG 24 hr tablet, Take 50 mg by mouth daily. Take with or immediately following a meal., Disp: , Rfl:    ondansetron (ZOFRAN) 4 MG tablet, Take 4 mg by mouth every 8 (eight) hours as needed for nausea or vomiting. (Patient not taking: Reported on 07/18/2022), Disp: , Rfl:    predniSONE (DELTASONE) 20 MG tablet, Take 2 tablets (40 mg total) by mouth daily., Disp: 10 tablet, Rfl: 0  Past Medical Problems: Past Medical History:  Diagnosis Date   Asthma    Chronic gastritis    Diabetes mellitus without complication (HCC)    Erosive esophagitis    Fundic gland polyps of stomach, benign    GERD (gastroesophageal reflux disease)    History of blood clots    History of hiatal hernia    Hypertension    Hypothyroidism    Migraine    Sleep apnea    Tubulovillous adenoma of colon     Past Surgical History: Past Surgical History:  Procedure Laterality Date   BREAST BIOPSY Right 07/24/2022   stereo bx calcs-"COIL" clip-path pending   CHOLECYSTECTOMY     COLONOSCOPY WITH PROPOFOL N/A 05/01/2015   Procedure: COLONOSCOPY WITH PROPOFOL;  Surgeon: Deveron Fly, MD;  Location: [facility] ENDOSCOPY;  Service: Endoscopy;  Laterality: N/A;   COLONOSCOPY WITH PROPOFOL N/A  07/30/2018   Procedure: COLONOSCOPY WITH PROPOFOL;  Surgeon: Deveron Fly, MD;  Location: [facility] ENDOSCOPY;  Service: Endoscopy;  Laterality: N/A;   COLONOSCOPY WITH PROPOFOL N/A 08/31/2018   Procedure: COLONOSCOPY WITH PROPOFOL;  Surgeon: Deveron Fly, MD;  Location[facility] ENDOSCOPY;  Service: Endoscopy;  Laterality: N/A;   ESOPHAGOGASTRODUODENOSCOPY N/A 05/01/2015   Procedure: ESOPHAGOGASTRODUODENOSCOPY (EGD);  Surgeon: Deveron Fly, MD;  Location: [facility] ENDOSCOPY;  Service: Endoscopy;  Laterality: N/A;   LAPAROSCOPIC SALPINGO OOPHERECTOMY      Social History: Social History   Socioeconomic History   Marital status: Married    Spouse name: steven   Number of children: 4   Years of education: Not on file   Highest education  level: Not on file  Occupational History   Not on file  Tobacco Use   Smoking status: Never   Smokeless tobacco: Never  Vaping Use   Vaping status: Never Used  Substance and Sexual Activity   Alcohol use: No   Drug use: No   Sexual activity: Yes    Partners: Male    Birth control/protection: None  Other Topics Concern   Not on file  Social History Narrative   Works at [employer] in [place]   Social Drivers of [employer] Strain: Low Risk  (11/05/2023)   Received from [facility]   Overall Financial Resource Strain (CARDIA)    Difficulty of Paying Living Expenses: Not hard at all  Food Insecurity: No Food Insecurity (11/05/2023)   Received from [facility]   Hunger Vital Sign    Worried About Running Out of Food in the Last Year: Never true    Ran Out of Food in the Last Year: Never true  Transportation Needs: No Transportation Needs (11/05/2023)   [facility] - Transportation    In the past 12 months, has lack of transportation kept you from medical appointments or from getting medications?: No    Lack of Transportation (Non-Medical): No   Physical Activity: Inactive (10/06/2019)   Exercise Vital Sign    Days of Exercise per Week: 0 days    Minutes of Exercise per Session: 0 min  Stress: No Stress Concern Present (10/06/2019)   [employer] of Occupational Health - Occupational Stress Questionnaire    Feeling of Stress : Only a little  Social Connections: Socially Integrated (10/06/2019)   Social Connection and Isolation Panel [NHANES]    Frequency of Communication with Friends and Family: More than three times a week    Frequency of Social Gatherings with Friends and Family: Never    Attends Religious Services: More than 4 times per year    Active Member of [employer] or Organizations: Yes    Attends Engineer, structural: More than 4 times per year    Marital Status: Married  Catering manager Violence: Not At Risk (10/06/2019)   Humiliation, Afraid, Rape, and Kick questionnaire    Fear of Current or Ex-Partner: No    Emotionally Abused: No    Physically Abused: No    Sexually Abused: No    Family History: Family History  Problem Relation Age of Onset   Diabetes Mother    Hypertension Mother    Arthritis Mother    Gout Father    Breast cancer Neg Hx     Review of Systems: Denies any fevers, chills or changes in her health  Physical Exam: Vital Signs BP 116/78 (BP Location: Right Arm, Patient Position: Sitting, Cuff Size: Large)   Pulse 83   Ht 5\' 2"  (1.575 m)   Wt 238 lb 9.6 oz (108.2 kg)   SpO2 100%   BMI 43.64 kg/m   Physical Exam  Constitutional:      General: Not in acute distress.    Appearance: Normal appearance. Not ill-appearing.  HENT:     Head: Normocephalic and atraumatic.  Neck:     Musculoskeletal: Normal range of motion.  Cardiovascular:     Rate and Rhythm: Normal rate Pulmonary:     Effort: Pulmonary effort is normal. No respiratory distress.  Musculoskeletal: Normal range of motion.  Skin:    General: Skin is warm and dry.  Findings: No erythema or rash.   Neurological:     Mental Status: Alert and oriented to person, place, and time. Mental status is at baseline.  Psychiatric:        Mood and Affect: Mood normal.        Behavior: Behavior normal.    Assessment/Plan: The patient is scheduled for bilateral breast reduction with Dr. Ulice Bold.  Risks, benefits, and alternatives of procedure discussed, questions answered and consent obtained.    Smoking Status: Non-smoker; Counseling Given?  N/A Last Mammogram: 11/02/2023; Results: BI-RADS Category 1 negative.  Caprini Score: 8; Risk Factors include: Age, BMI > 40, history of DVT/PE and length of planned surgery. Recommendation for mechanical and possible pharmacological prophylaxis. Encourage early ambulation.  Will discuss the possibility of postoperative Lovenox with Dr. Ulice Bold.  Pictures obtained: @consult   Post-op Rx sent to pharmacy:  Keflex, Zofran  -discussed with patient that she will need to call her pain management provider for any supplemental pain medications.  Patient expressed understanding.  Instructed patient to hold her hydrochlorothiazide and her benazepril the day of surgery.  Patient expressed understanding.  Patient states that she is not taking any sort of multivitamins or supplements.  Patient was provided with the breast reduction and General Surgical Risk consent document and Pain Medication Agreement prior to their appointment.  They had adequate time to read through the risk consent documents and Pain Medication Agreement. We also discussed them in person together during this preop appointment. All of their questions were answered to their satisfaction.  Recommended calling if they have any further questions.  Risk consent form and Pain Medication Agreement to be scanned into patient's chart.  The risk that can be encountered with breast reduction were discussed and include the following but not limited to these:  Breast asymmetry, fluid accumulation, firmness of  the breast, inability to breast feed, loss of nipple or areola, skin loss, decrease or no nipple sensation, fat necrosis of the breast tissue, bleeding, infection, healing delay.  There are risks of anesthesia, changes to skin sensation and injury to nerves or blood vessels.  The muscle can be temporarily or permanently injured.  You may have an allergic reaction to tape, suture, glue, blood products which can result in skin discoloration, swelling, pain, skin lesions, poor healing.  Any of these can lead to the need for revisonal surgery or stage procedures.  A reduction has potential to interfere with diagnostic procedures.  Nipple or breast piercing can increase risks of infection.  This procedure is best done when the breast is fully developed.  Changes in the breast will continue to occur over time.  Pregnancy can alter the outcomes of previous breast reduction surgery, weight gain and weigh loss can also effect the long term appearance.   We discussed the possibility of amputation/free nipple graft technique due to the length of her STN.  She is understanding of the possibility that we would need to transition from a pedicle technique to a free nipple graft technique intraoperatively.  We discussed the risks associated with free nipple graft breast reductions, including but not limited to failure of the graft, partial loss of the graft, loss of sensation of bilateral nipple areola, complete loss of the nipple areola graft, inability to breast-feed, postoperative wounds, ongoing wound care.  We also discussed the risks associated with the pedicle technique.  We discussed that with the pedicle technique she could develop nipple areolar necrosis which would result in loss of the nipple, this would  also result in ongoing wound care and possible changes in the shape of her breast.     Electronically signed by: Harden Leyden, PA-C 02/18/2024 9:58 AM

## 2024-02-18 NOTE — H&P (View-Only) (Signed)
 Patient ID: Stacey Santos, female    DOB: 1966/10/27, 58 y.o.   MRN: [id number]  Chief Complaint  Patient presents with   Pre-op Exam      ICD-10-CM   1. Symptomatic mammary hypertrophy  N62        History of Present Illness: Stacey Santos is a 58 y.o.  female  with a history of macromastia.  She presents for preoperative evaluation for upcoming procedure, Bilateral Breast Reduction wit liposuction, scheduled for 03/09/2024 with Dr.  Ulice Bold  The patient has not had problems with anesthesia.  Patient denies any personal or family history of breast cancer.  She denies any history of cardiac issues.  She states that she is not taking blood thinners at this time.  She reports she is not a smoker.  Patient denies taking any birth control or hormone replacement.  She denies any history of miscarriages.  Patient does states she had a DVT and PE about 22 to 23 years ago after she broke her right ankle.  She states that she was on blood thinners for 6 months, but has not been on any blood thinner since then.  She denies any issues since then.  She denies any family history of blood clots.  She denies any personal or family history of clotting diseases.  She denies any recent traumas, surgeries or infections.  She denies any history of stroke or heart attack.  She denies any history of Crohn's disease or ulcerative colitis.  She denies any history of COPD.  She does reports she has asthma, which is well-controlled.  She denies any history of cancer.  She denies any varicosities to her lower extremities.  She denies any recent fevers, chills or changes in her health.  Patient reports she is currently a G/H cup.  Patient states she would like to be as small as possible.  Discussed with patient that cup size cannot be guaranteed.  Patient expressed understanding.  Summary of Previous Visit: Patient was seen for consult by Dr. Ulice Bold on 05/13/2023.  At this visit, patient complained of  extremely large breasts causing back and shoulder pain.  Her STN bilaterally was 45 cm.  Her BMI was 45.2 kg/m.  The estimated amount for removal was 900 g from each breast.  Patient was found to be a candidate for bilateral breast reduction with liposuction.  It was noted that patient will need an amputation technique.  Estimated excess breast tissue to be removed at time of surgery: 900 grams  Job: Works as a Runner, broadcasting/film/video, planning to take 2 weeks off  PMH Significant for: Hypertension, asthma, GERD, hypothyroidism, chronic pain syndrome  Per chart review, patient's most recent hemoglobin A1c was 6.8 on 11/05/2023.  Patient states that she has never been diagnosed with diabetes before and gets her A1c checked annually.  She states that she has been borderline diabetic.  She states that she was placed on metformin in the past for a few months, but no longer takes metformin or any sort of diabetic medications.  Patient also sees pain management for chronic hip and back pain.  She states that she takes tramadol at home.   Past Medical History: Allergies: No Known Allergies  Current Medications:  Current Outpatient Medications:    albuterol (PROVENTIL HFA;VENTOLIN HFA) 108 (90 BASE) MCG/ACT inhaler, Inhale into the lungs every 4 (four) hours as needed for wheezing or shortness of breath. , Disp: , Rfl:    albuterol (PROVENTIL) (2.5 MG/3ML) 0.083% nebulizer  solution, Take 3 mLs (2.5 mg total) by nebulization every 6 (six) hours as needed for wheezing or shortness of breath., Disp: 150 mL, Rfl: 1   albuterol (VENTOLIN HFA) 108 (90 Base) MCG/ACT inhaler, Inhale 2 puffs into the lungs every 2 (two) hours as needed for wheezing or shortness of breath (cough)., Disp: 8 g, Rfl: 0   hydrochlorothiazide (HYDRODIURIL) 12.5 MG tablet, Take 12.5 mg by mouth daily. As needed for swelling of legs, Disp: , Rfl:    levothyroxine (SYNTHROID, LEVOTHROID) 25 MCG tablet, Take 25 mcg by mouth daily. Take with a glass of  water 30-60 minutes before breakfast, Disp: , Rfl:    meloxicam (MOBIC) 15 MG tablet, Take 15 mg by mouth daily., Disp: , Rfl:    traMADol (ULTRAM) 50 MG tablet, Take by mouth every 6 (six) hours as needed., Disp: , Rfl:    amLODipine (NORVASC) 5 MG tablet, Take 5 mg by mouth daily., Disp: , Rfl:    cyclobenzaprine (FLEXERIL) 10 MG tablet, Take 1 tablet (10 mg total) by mouth at bedtime as needed for muscle spasms., Disp: 90 tablet, Rfl: 1   DULoxetine (CYMBALTA) 30 MG capsule, Take 1 capsule (30 mg total) by mouth 2 (two) times daily. (Patient not taking: Reported on 07/18/2022), Disp: 180 capsule, Rfl: 1   lansoprazole (PREVACID) 30 MG capsule, Take 30 mg by mouth daily. DR capsule, Disp: , Rfl:    metoprolol succinate (TOPROL-XL) 50 MG 24 hr tablet, Take 50 mg by mouth daily. Take with or immediately following a meal., Disp: , Rfl:    ondansetron (ZOFRAN) 4 MG tablet, Take 4 mg by mouth every 8 (eight) hours as needed for nausea or vomiting. (Patient not taking: Reported on 07/18/2022), Disp: , Rfl:    predniSONE (DELTASONE) 20 MG tablet, Take 2 tablets (40 mg total) by mouth daily., Disp: 10 tablet, Rfl: 0  Past Medical Problems: Past Medical History:  Diagnosis Date   Asthma    Chronic gastritis    Diabetes mellitus without complication (HCC)    Erosive esophagitis    Fundic gland polyps of stomach, benign    GERD (gastroesophageal reflux disease)    History of blood clots    History of hiatal hernia    Hypertension    Hypothyroidism    Migraine    Sleep apnea    Tubulovillous adenoma of colon     Past Surgical History: Past Surgical History:  Procedure Laterality Date   BREAST BIOPSY Right 07/24/2022   stereo bx calcs-"COIL" clip-path pending   CHOLECYSTECTOMY     COLONOSCOPY WITH PROPOFOL N/A 05/01/2015   Procedure: COLONOSCOPY WITH PROPOFOL;  Surgeon: Deveron Fly, MD;  Location: [facility] ENDOSCOPY;  Service: Endoscopy;  Laterality: N/A;   COLONOSCOPY WITH PROPOFOL N/A  07/30/2018   Procedure: COLONOSCOPY WITH PROPOFOL;  Surgeon: Deveron Fly, MD;  Location: [facility] ENDOSCOPY;  Service: Endoscopy;  Laterality: N/A;   COLONOSCOPY WITH PROPOFOL N/A 08/31/2018   Procedure: COLONOSCOPY WITH PROPOFOL;  Surgeon: Deveron Fly, MD;  Location: [facility] ENDOSCOPY;  Service: Endoscopy;  Laterality: N/A;   ESOPHAGOGASTRODUODENOSCOPY N/A 05/01/2015   Procedure: ESOPHAGOGASTRODUODENOSCOPY (EGD);  Surgeon: Deveron Fly, MD;  Location: [facility] ENDOSCOPY;  Service: Endoscopy;  Laterality: N/A;   LAPAROSCOPIC SALPINGO OOPHERECTOMY      Social History: Social History   Socioeconomic History   Marital status: Married    Spouse name: steven   Number of children: 4   Years of education: Not on file   Highest education  level: Not on file  Occupational History   Not on file  Tobacco Use   Smoking status: Never   Smokeless tobacco: Never  Vaping Use   Vaping status: Never Used  Substance and Sexual Activity   Alcohol use: No   Drug use: No   Sexual activity: Yes    Partners: Male    Birth control/protection: None  Other Topics Concern   Not on file  Social History Narrative   Works at [employer] in [place]   Social Drivers of [employer] Strain: Low Risk  (11/05/2023)   Received from [facility]   Overall Financial Resource Strain (CARDIA)    Difficulty of Paying Living Expenses: Not hard at all  Food Insecurity: No Food Insecurity (11/05/2023)   Received from [facility]   Hunger Vital Sign    Worried About Running Out of Food in the Last Year: Never true    Ran Out of Food in the Last Year: Never true  Transportation Needs: No Transportation Needs (11/05/2023)   [facility] from [facility] - Transportation    In the past 12 months, has lack of transportation kept you from medical appointments or from getting medications?: No    Lack of Transportation (Non-Medical): No   Physical Activity: Inactive (10/06/2019)   Exercise Vital Sign    Days of Exercise per Week: 0 days    Minutes of Exercise per Session: 0 min  Stress: No Stress Concern Present (10/06/2019)   [employer] of Occupational Health - Occupational Stress Questionnaire    Feeling of Stress : Only a little  Social Connections: Socially Integrated (10/06/2019)   Social Connection and Isolation Panel [NHANES]    Frequency of Communication with Friends and Family: More than three times a week    Frequency of Social Gatherings with Friends and Family: Never    Attends Religious Services: More than 4 times per year    Active Member of [employer] or Organizations: Yes    Attends Engineer, structural: More than 4 times per year    Marital Status: Married  Catering manager Violence: Not At Risk (10/06/2019)   Humiliation, Afraid, Rape, and Kick questionnaire    Fear of Current or Ex-Partner: No    Emotionally Abused: No    Physically Abused: No    Sexually Abused: No    Family History: Family History  Problem Relation Age of Onset   Diabetes Mother    Hypertension Mother    Arthritis Mother    Gout Father    Breast cancer Neg Hx     Review of Systems: Denies any fevers, chills or changes in her health  Physical Exam: Vital Signs BP 116/78 (BP Location: Right Arm, Patient Position: Sitting, Cuff Size: Large)   Pulse 83   Ht 5\' 2"  (1.575 m)   Wt 238 lb 9.6 oz (108.2 kg)   SpO2 100%   BMI 43.64 kg/m   Physical Exam  Constitutional:      General: Not in acute distress.    Appearance: Normal appearance. Not ill-appearing.  HENT:     Head: Normocephalic and atraumatic.  Neck:     Musculoskeletal: Normal range of motion.  Cardiovascular:     Rate and Rhythm: Normal rate Pulmonary:     Effort: Pulmonary effort is normal. No respiratory distress.  Musculoskeletal: Normal range of motion.  Skin:    General: Skin is warm and dry.  Findings: No erythema or rash.   Neurological:     Mental Status: Alert and oriented to person, place, and time. Mental status is at baseline.  Psychiatric:        Mood and Affect: Mood normal.        Behavior: Behavior normal.    Assessment/Plan: The patient is scheduled for bilateral breast reduction with Dr. Ulice Bold.  Risks, benefits, and alternatives of procedure discussed, questions answered and consent obtained.    Smoking Status: Non-smoker; Counseling Given?  N/A Last Mammogram: 11/02/2023; Results: BI-RADS Category 1 negative.  Caprini Score: 8; Risk Factors include: Age, BMI > 40, history of DVT/PE and length of planned surgery. Recommendation for mechanical and possible pharmacological prophylaxis. Encourage early ambulation.  Will discuss the possibility of postoperative Lovenox with Dr. Ulice Bold.  Pictures obtained: @consult   Post-op Rx sent to pharmacy:  Keflex, Zofran  -discussed with patient that she will need to call her pain management provider for any supplemental pain medications.  Patient expressed understanding.  Instructed patient to hold her hydrochlorothiazide and her benazepril the day of surgery.  Patient expressed understanding.  Patient states that she is not taking any sort of multivitamins or supplements.  Patient was provided with the breast reduction and General Surgical Risk consent document and Pain Medication Agreement prior to their appointment.  They had adequate time to read through the risk consent documents and Pain Medication Agreement. We also discussed them in person together during this preop appointment. All of their questions were answered to their satisfaction.  Recommended calling if they have any further questions.  Risk consent form and Pain Medication Agreement to be scanned into patient's chart.  The risk that can be encountered with breast reduction were discussed and include the following but not limited to these:  Breast asymmetry, fluid accumulation, firmness of  the breast, inability to breast feed, loss of nipple or areola, skin loss, decrease or no nipple sensation, fat necrosis of the breast tissue, bleeding, infection, healing delay.  There are risks of anesthesia, changes to skin sensation and injury to nerves or blood vessels.  The muscle can be temporarily or permanently injured.  You may have an allergic reaction to tape, suture, glue, blood products which can result in skin discoloration, swelling, pain, skin lesions, poor healing.  Any of these can lead to the need for revisonal surgery or stage procedures.  A reduction has potential to interfere with diagnostic procedures.  Nipple or breast piercing can increase risks of infection.  This procedure is best done when the breast is fully developed.  Changes in the breast will continue to occur over time.  Pregnancy can alter the outcomes of previous breast reduction surgery, weight gain and weigh loss can also effect the long term appearance.   We discussed the possibility of amputation/free nipple graft technique due to the length of her STN.  She is understanding of the possibility that we would need to transition from a pedicle technique to a free nipple graft technique intraoperatively.  We discussed the risks associated with free nipple graft breast reductions, including but not limited to failure of the graft, partial loss of the graft, loss of sensation of bilateral nipple areola, complete loss of the nipple areola graft, inability to breast-feed, postoperative wounds, ongoing wound care.  We also discussed the risks associated with the pedicle technique.  We discussed that with the pedicle technique she could develop nipple areolar necrosis which would result in loss of the nipple, this would  also result in ongoing wound care and possible changes in the shape of her breast.     Electronically signed by: Harden Leyden, PA-C 02/18/2024 9:58 AM

## 2024-03-03 ENCOUNTER — Other Ambulatory Visit: Payer: Self-pay

## 2024-03-03 ENCOUNTER — Encounter (HOSPITAL_BASED_OUTPATIENT_CLINIC_OR_DEPARTMENT_OTHER): Payer: Self-pay | Admitting: *Deleted

## 2024-03-07 ENCOUNTER — Encounter (HOSPITAL_BASED_OUTPATIENT_CLINIC_OR_DEPARTMENT_OTHER)
Admission: RE | Admit: 2024-03-07 | Discharge: 2024-03-07 | Disposition: A | Discharge status: Home / Self Care | Source: Ambulatory Visit | Attending: Plastic Surgery | Admitting: Plastic Surgery

## 2024-03-07 DIAGNOSIS — Z01810 Encounter for preprocedural cardiovascular examination: Secondary | ICD-10-CM | POA: Diagnosis not present

## 2024-03-07 DIAGNOSIS — I10 Essential (primary) hypertension: Secondary | ICD-10-CM | POA: Insufficient documentation

## 2024-03-07 DIAGNOSIS — I4510 Unspecified right bundle-branch block: Secondary | ICD-10-CM | POA: Insufficient documentation

## 2024-03-07 DIAGNOSIS — Z01818 Encounter for other preprocedural examination: Secondary | ICD-10-CM | POA: Diagnosis present

## 2024-03-08 NOTE — Anesthesia Preprocedure Evaluation (Signed)
 Anesthesia Evaluation  Patient identified by MRN, date of birth, ID band Patient awake    Reviewed: Allergy & Precautions, NPO status , Patient's Chart, lab work & pertinent test results  History of Anesthesia Complications Negative for: history of anesthetic complications  Airway Mallampati: II  TM Distance: >3 FB Neck ROM: Full    Dental  (+) Dental Advisory Given, Teeth Intact   Pulmonary asthma , sleep apnea    Pulmonary exam normal        Cardiovascular hypertension, Pt. on medications Normal cardiovascular exam     Neuro/Psych  Headaches  negative psych ROS   GI/Hepatic Neg liver ROS, hiatal hernia, PUD,GERD  Medicated and Controlled,,  Endo/Other  diabetes, Type 2Hypothyroidism  Class 3 obesity  Renal/GU negative Renal ROS     Musculoskeletal  (+) Arthritis ,    Abdominal  (+) + obese  Peds  Hematology negative hematology ROS (+)   Anesthesia Other Findings Chronic pain   Reproductive/Obstetrics                              Anesthesia Physical Anesthesia Plan  ASA: 3  Anesthesia Plan: General   Post-op Pain Management: Tylenol  PO (pre-op)* and Celebrex PO (pre-op)*   Induction: Intravenous  PONV Risk Score and Plan: 3 and Treatment may vary due to age or medical condition, Ondansetron , Scopolamine patch - Pre-op, Dexamethasone and Midazolam   Airway Management Planned: LMA  Additional Equipment: None  Intra-op Plan:   Post-operative Plan: Extubation in OR  Informed Consent: I have reviewed the patients History and Physical, chart, labs and discussed the procedure including the risks, benefits and alternatives for the proposed anesthesia with the patient or authorized representative who has indicated his/her understanding and acceptance.     Dental advisory given  Plan Discussed with: CRNA and Anesthesiologist  Anesthesia Plan Comments:         Anesthesia  Quick Evaluation

## 2024-03-09 ENCOUNTER — Ambulatory Visit (HOSPITAL_BASED_OUTPATIENT_CLINIC_OR_DEPARTMENT_OTHER)
Admission: RE | Admit: 2024-03-09 | Discharge: 2024-03-09 | Disposition: A | Discharge status: Home / Self Care | Attending: Plastic Surgery | Admitting: Plastic Surgery

## 2024-03-09 ENCOUNTER — Encounter (HOSPITAL_BASED_OUTPATIENT_CLINIC_OR_DEPARTMENT_OTHER): Payer: Self-pay | Admitting: Plastic Surgery

## 2024-03-09 ENCOUNTER — Other Ambulatory Visit: Payer: Self-pay

## 2024-03-09 ENCOUNTER — Ambulatory Visit (HOSPITAL_BASED_OUTPATIENT_CLINIC_OR_DEPARTMENT_OTHER): Payer: Self-pay | Admitting: Anesthesiology

## 2024-03-09 ENCOUNTER — Encounter (HOSPITAL_BASED_OUTPATIENT_CLINIC_OR_DEPARTMENT_OTHER)
Admission: RE | Disposition: A | Discharge status: Home / Self Care | Payer: Self-pay | Source: Home / Self Care | Attending: Plastic Surgery

## 2024-03-09 ENCOUNTER — Ambulatory Visit (HOSPITAL_BASED_OUTPATIENT_CLINIC_OR_DEPARTMENT_OTHER): Admitting: Anesthesiology

## 2024-03-09 DIAGNOSIS — E039 Hypothyroidism, unspecified: Secondary | ICD-10-CM

## 2024-03-09 DIAGNOSIS — Z8711 Personal history of peptic ulcer disease: Secondary | ICD-10-CM | POA: Diagnosis not present

## 2024-03-09 DIAGNOSIS — J45909 Unspecified asthma, uncomplicated: Secondary | ICD-10-CM | POA: Diagnosis not present

## 2024-03-09 DIAGNOSIS — Z7901 Long term (current) use of anticoagulants: Secondary | ICD-10-CM | POA: Insufficient documentation

## 2024-03-09 DIAGNOSIS — Z86718 Personal history of other venous thrombosis and embolism: Secondary | ICD-10-CM | POA: Diagnosis not present

## 2024-03-09 DIAGNOSIS — N62 Hypertrophy of breast: Secondary | ICD-10-CM | POA: Insufficient documentation

## 2024-03-09 DIAGNOSIS — E119 Type 2 diabetes mellitus without complications: Secondary | ICD-10-CM | POA: Insufficient documentation

## 2024-03-09 DIAGNOSIS — M549 Dorsalgia, unspecified: Secondary | ICD-10-CM | POA: Diagnosis not present

## 2024-03-09 DIAGNOSIS — G4730 Sleep apnea, unspecified: Secondary | ICD-10-CM | POA: Insufficient documentation

## 2024-03-09 DIAGNOSIS — I10 Essential (primary) hypertension: Secondary | ICD-10-CM | POA: Insufficient documentation

## 2024-03-09 DIAGNOSIS — K219 Gastro-esophageal reflux disease without esophagitis: Secondary | ICD-10-CM | POA: Insufficient documentation

## 2024-03-09 DIAGNOSIS — M542 Cervicalgia: Secondary | ICD-10-CM | POA: Diagnosis not present

## 2024-03-09 DIAGNOSIS — M1990 Unspecified osteoarthritis, unspecified site: Secondary | ICD-10-CM | POA: Insufficient documentation

## 2024-03-09 DIAGNOSIS — Z86711 Personal history of pulmonary embolism: Secondary | ICD-10-CM | POA: Diagnosis not present

## 2024-03-09 HISTORY — PX: BREAST REDUCTION SURGERY: SHX8

## 2024-03-09 SURGERY — BREAST REDUCTION WITH LIPOSUCTION
Anesthesia: General | Site: Breast | Laterality: Bilateral

## 2024-03-09 MED ORDER — LIDOCAINE 2% (20 MG/ML) 5 ML SYRINGE
INTRAMUSCULAR | Status: DC | PRN
  Administered 2024-03-09: 60 mg via INTRAVENOUS

## 2024-03-09 MED ORDER — ONDANSETRON HCL 4 MG/2ML IJ SOLN
INTRAMUSCULAR | Status: AC
  Filled 2024-03-09: qty 2

## 2024-03-09 MED ORDER — OXYCODONE HCL 5 MG PO TABS
5.0000 mg | ORAL_TABLET | Freq: Once | ORAL | Status: AC | PRN
  Administered 2024-03-09: 5 mg via ORAL

## 2024-03-09 MED ORDER — SUCCINYLCHOLINE CHLORIDE 200 MG/10ML IV SOSY
PREFILLED_SYRINGE | INTRAVENOUS | Status: AC
  Filled 2024-03-09: qty 10

## 2024-03-09 MED ORDER — SODIUM CHLORIDE 0.9 % IV SOLN
INTRAVENOUS | Status: DC | PRN
  Administered 2024-03-09: 40 mL

## 2024-03-09 MED ORDER — SCOPOLAMINE 1 MG/3DAYS TD PT72
1.0000 | MEDICATED_PATCH | TRANSDERMAL | Status: DC
  Administered 2024-03-09: 1.5 mg via TRANSDERMAL

## 2024-03-09 MED ORDER — CELECOXIB 200 MG PO CAPS
200.0000 mg | ORAL_CAPSULE | Freq: Once | ORAL | Status: AC
  Administered 2024-03-09: 200 mg via ORAL

## 2024-03-09 MED ORDER — BUPIVACAINE-EPINEPHRINE (PF) 0.25% -1:200000 IJ SOLN
INTRAMUSCULAR | Status: AC
  Filled 2024-03-09: qty 30

## 2024-03-09 MED ORDER — LACTATED RINGERS IV SOLN: INTRAVENOUS | Status: DC

## 2024-03-09 MED ORDER — FENTANYL CITRATE (PF) 100 MCG/2ML IJ SOLN
INTRAMUSCULAR | Status: AC
  Filled 2024-03-09: qty 2

## 2024-03-09 MED ORDER — BUPIVACAINE HCL (PF) 0.25 % IJ SOLN
INTRAMUSCULAR | Status: AC
  Filled 2024-03-09: qty 60

## 2024-03-09 MED ORDER — MIDAZOLAM HCL 5 MG/5ML IJ SOLN
INTRAMUSCULAR | Status: DC | PRN
  Administered 2024-03-09: 2 mg via INTRAVENOUS

## 2024-03-09 MED ORDER — HYDROMORPHONE HCL 1 MG/ML IJ SOLN
INTRAMUSCULAR | Status: AC
  Filled 2024-03-09: qty 0.5

## 2024-03-09 MED ORDER — PHENYLEPHRINE HCL (PRESSORS) 10 MG/ML IV SOLN
INTRAVENOUS | Status: DC | PRN
  Administered 2024-03-09: 80 ug via INTRAVENOUS
  Administered 2024-03-09: 160 ug via INTRAVENOUS

## 2024-03-09 MED ORDER — SODIUM CHLORIDE 0.9 % IV SOLN
12.5000 mg | INTRAVENOUS | Status: DC | PRN
  Filled 2024-03-09: qty 0.5

## 2024-03-09 MED ORDER — KETAMINE HCL 10 MG/ML IJ SOLN
INTRAMUSCULAR | Status: DC | PRN
Start: 2024-03-09 — End: 2024-03-09
  Administered 2024-03-09: 10 mg via INTRAVENOUS
  Administered 2024-03-09: 30 mg via INTRAVENOUS

## 2024-03-09 MED ORDER — ACETAMINOPHEN 500 MG PO TABS
1000.0000 mg | ORAL_TABLET | Freq: Once | ORAL | Status: AC
  Administered 2024-03-09: 1000 mg via ORAL

## 2024-03-09 MED ORDER — DEXAMETHASONE SODIUM PHOSPHATE 10 MG/ML IJ SOLN
INTRAMUSCULAR | Status: AC
  Filled 2024-03-09: qty 1

## 2024-03-09 MED ORDER — TRANEXAMIC ACID-NACL 1000-0.7 MG/100ML-% IV SOLN
INTRAVENOUS | Status: DC | PRN
Start: 2024-03-09 — End: 2024-03-09
  Administered 2024-03-09: 1000 mg via INTRAVENOUS

## 2024-03-09 MED ORDER — BUPIVACAINE-EPINEPHRINE (PF) 0.25% -1:200000 IJ SOLN
INTRAMUSCULAR | Status: AC
Start: 2024-03-09 — End: ?
  Filled 2024-03-09: qty 30

## 2024-03-09 MED ORDER — LIDOCAINE-EPINEPHRINE 1 %-1:100000 IJ SOLN
INTRAMUSCULAR | Status: DC | PRN
  Administered 2024-03-09: 50 mL

## 2024-03-09 MED ORDER — PHENYLEPHRINE 80 MCG/ML (10ML) SYRINGE FOR IV PUSH (FOR BLOOD PRESSURE SUPPORT)
PREFILLED_SYRINGE | INTRAVENOUS | Status: AC
  Filled 2024-03-09: qty 10

## 2024-03-09 MED ORDER — SODIUM CHLORIDE (PF) 0.9 % IJ SOLN
INTRAMUSCULAR | Status: AC
  Filled 2024-03-09: qty 20

## 2024-03-09 MED ORDER — LIDOCAINE-EPINEPHRINE 1 %-1:100000 IJ SOLN
INTRAMUSCULAR | Status: AC
  Filled 2024-03-09: qty 2

## 2024-03-09 MED ORDER — CEFAZOLIN SODIUM-DEXTROSE 2-4 GM/100ML-% IV SOLN
INTRAVENOUS | Status: AC
  Filled 2024-03-09: qty 100

## 2024-03-09 MED ORDER — OXYCODONE HCL 5 MG/5ML PO SOLN: 5.0000 mg | Freq: Once | ORAL | Status: AC | PRN

## 2024-03-09 MED ORDER — ATROPINE SULFATE 0.4 MG/ML IV SOLN
INTRAVENOUS | Status: AC
  Filled 2024-03-09: qty 1

## 2024-03-09 MED ORDER — FENTANYL CITRATE (PF) 100 MCG/2ML IJ SOLN: 25.0000 ug | INTRAMUSCULAR | Status: DC | PRN

## 2024-03-09 MED ORDER — MIDAZOLAM HCL 2 MG/2ML IJ SOLN
INTRAMUSCULAR | Status: AC
  Filled 2024-03-09: qty 2

## 2024-03-09 MED ORDER — CHLORHEXIDINE GLUCONATE CLOTH 2 % EX PADS: 6.0000 | MEDICATED_PAD | Freq: Once | CUTANEOUS | Status: DC

## 2024-03-09 MED ORDER — CEFAZOLIN SODIUM-DEXTROSE 2-4 GM/100ML-% IV SOLN
2.0000 g | INTRAVENOUS | Status: AC
  Administered 2024-03-09: 2 g via INTRAVENOUS

## 2024-03-09 MED ORDER — EPHEDRINE 5 MG/ML INJ
INTRAVENOUS | Status: AC
  Filled 2024-03-09: qty 5

## 2024-03-09 MED ORDER — KETAMINE HCL 50 MG/5ML IJ SOSY
PREFILLED_SYRINGE | INTRAMUSCULAR | Status: AC
  Filled 2024-03-09: qty 5

## 2024-03-09 MED ORDER — AMISULPRIDE (ANTIEMETIC) 5 MG/2ML IV SOLN: 10.0000 mg | Freq: Once | INTRAVENOUS | Status: DC | PRN

## 2024-03-09 MED ORDER — FENTANYL CITRATE (PF) 100 MCG/2ML IJ SOLN
25.0000 ug | INTRAMUSCULAR | Status: DC | PRN
  Administered 2024-03-09: 25 ug via INTRAVENOUS

## 2024-03-09 MED ORDER — SODIUM CHLORIDE 0.9% FLUSH: 3.0000 mL | INTRAVENOUS | Status: DC | PRN

## 2024-03-09 MED ORDER — ACETAMINOPHEN 500 MG PO TABS
ORAL_TABLET | ORAL | Status: AC
  Filled 2024-03-09: qty 2

## 2024-03-09 MED ORDER — VASHE WOUND IRRIGATION OPTIME
TOPICAL | Status: DC | PRN
  Administered 2024-03-09: 34 [oz_av]

## 2024-03-09 MED ORDER — OXYCODONE HCL 5 MG PO TABS
ORAL_TABLET | ORAL | Status: AC
  Filled 2024-03-09: qty 1

## 2024-03-09 MED ORDER — SODIUM CHLORIDE 0.9% FLUSH: 3.0000 mL | Freq: Two times a day (BID) | INTRAVENOUS | Status: DC

## 2024-03-09 MED ORDER — PROPOFOL 10 MG/ML IV BOLUS
INTRAVENOUS | Status: DC | PRN
  Administered 2024-03-09: 200 mg via INTRAVENOUS

## 2024-03-09 MED ORDER — LIDOCAINE 2% (20 MG/ML) 5 ML SYRINGE
INTRAMUSCULAR | Status: AC
  Filled 2024-03-09: qty 5

## 2024-03-09 MED ORDER — CELECOXIB 200 MG PO CAPS
ORAL_CAPSULE | ORAL | Status: AC
  Filled 2024-03-09: qty 1

## 2024-03-09 MED ORDER — ACETAMINOPHEN 325 MG RE SUPP: 650.0000 mg | RECTAL | Status: DC | PRN

## 2024-03-09 MED ORDER — SODIUM CHLORIDE 0.9 % IV SOLN: 250.0000 mL | INTRAVENOUS | Status: DC | PRN

## 2024-03-09 MED ORDER — ACETAMINOPHEN 325 MG PO TABS: 650.0000 mg | ORAL_TABLET | ORAL | Status: DC | PRN

## 2024-03-09 MED ORDER — LIDOCAINE HCL 1 % IJ SOLN
INTRAVENOUS | Status: DC | PRN
  Administered 2024-03-09: 600 mL

## 2024-03-09 MED ORDER — OXYCODONE HCL 5 MG PO TABS: 5.0000 mg | ORAL_TABLET | ORAL | Status: DC | PRN

## 2024-03-09 MED ORDER — LIDOCAINE HCL (PF) 1 % IJ SOLN
INTRAMUSCULAR | Status: AC
  Filled 2024-03-09: qty 60

## 2024-03-09 MED ORDER — SCOPOLAMINE 1 MG/3DAYS TD PT72
MEDICATED_PATCH | TRANSDERMAL | Status: AC
  Filled 2024-03-09: qty 1

## 2024-03-09 MED ORDER — EPHEDRINE SULFATE (PRESSORS) 50 MG/ML IJ SOLN
INTRAMUSCULAR | Status: DC | PRN
Start: 2024-03-09 — End: 2024-03-09
  Administered 2024-03-09: 10 mg via INTRAVENOUS

## 2024-03-09 MED ORDER — FENTANYL CITRATE (PF) 100 MCG/2ML IJ SOLN
INTRAMUSCULAR | Status: DC | PRN
  Administered 2024-03-09: 50 ug via INTRAVENOUS
  Administered 2024-03-09: 100 ug via INTRAVENOUS
  Administered 2024-03-09: 50 ug via INTRAVENOUS

## 2024-03-09 MED ORDER — BUPIVACAINE LIPOSOME 1.3 % IJ SUSP
INTRAMUSCULAR | Status: AC
  Filled 2024-03-09: qty 20

## 2024-03-09 MED ORDER — DEXAMETHASONE SODIUM PHOSPHATE 4 MG/ML IJ SOLN
INTRAMUSCULAR | Status: DC | PRN
  Administered 2024-03-09: 5 mg via INTRAVENOUS

## 2024-03-09 MED ORDER — EPINEPHRINE PF 1 MG/ML IJ SOLN
INTRAMUSCULAR | Status: AC
  Filled 2024-03-09: qty 1

## 2024-03-09 MED ORDER — PHENYLEPHRINE HCL-NACL 20-0.9 MG/250ML-% IV SOLN
INTRAVENOUS | Status: DC | PRN
  Administered 2024-03-09: 40 ug/min via INTRAVENOUS

## 2024-03-09 MED ORDER — LIDOCAINE-EPINEPHRINE (PF) 1 %-1:200000 IJ SOLN
INTRAMUSCULAR | Status: AC
  Filled 2024-03-09: qty 30

## 2024-03-09 SURGICAL SUPPLY — 68 items
BINDER BREAST LRG (GAUZE/BANDAGES/DRESSINGS) IMPLANT
BINDER BREAST MEDIUM (GAUZE/BANDAGES/DRESSINGS) IMPLANT
BINDER BREAST XLRG (GAUZE/BANDAGES/DRESSINGS) IMPLANT
BINDER BREAST XXLRG (GAUZE/BANDAGES/DRESSINGS) IMPLANT
BIOPATCH RED 1 DISK 7.0 (GAUZE/BANDAGES/DRESSINGS) IMPLANT
BLADE HEX COATED 2.75 (ELECTRODE) IMPLANT
BLADE KNIFE PERSONA 10 (BLADE) ×2 IMPLANT
BLADE SURG 15 STRL LF DISP TIS (BLADE) ×1 IMPLANT
CANISTER SUCT 1200ML W/VALVE (MISCELLANEOUS) ×1 IMPLANT
CLEANSER WND VASHE 34 (WOUND CARE) ×1 IMPLANT
COTTONBALL LRG STERILE PKG (GAUZE/BANDAGES/DRESSINGS) IMPLANT
COVER BACK TABLE 60X90IN (DRAPES) ×1 IMPLANT
COVER MAYO STAND STRL (DRAPES) ×1 IMPLANT
DERMABOND ADVANCED .7 DNX12 (GAUZE/BANDAGES/DRESSINGS) ×2 IMPLANT
DRAIN CHANNEL 15F RND FF W/TCR (WOUND CARE) IMPLANT
DRAIN CHANNEL 19F RND (DRAIN) IMPLANT
DRAPE LAPAROSCOPIC ABDOMINAL (DRAPES) ×1 IMPLANT
DRAPE UTILITY XL STRL (DRAPES) ×1 IMPLANT
DRSG MEPILEX POST OP 4X8 (GAUZE/BANDAGES/DRESSINGS) ×2 IMPLANT
DRSG TEGADERM 4X4.75 (GAUZE/BANDAGES/DRESSINGS) IMPLANT
ELECTRODE BLDE 4.0 EZ CLN MEGD (MISCELLANEOUS) ×1 IMPLANT
ELECTRODE REM PT RTRN 9FT ADLT (ELECTROSURGICAL) ×1 IMPLANT
EVACUATOR SILICONE 100CC (DRAIN) IMPLANT
GAUZE PAD ABD 8X10 STRL (GAUZE/BANDAGES/DRESSINGS) ×2 IMPLANT
GAUZE XEROFORM 5X9 LF (GAUZE/BANDAGES/DRESSINGS) IMPLANT
GLOVE BIO SURGEON STRL SZ 6.5 (GLOVE) ×2 IMPLANT
GLOVE BIO SURGEON STRL SZ7.5 (GLOVE) ×1 IMPLANT
GLOVE BIOGEL PI IND STRL 7.0 (GLOVE) IMPLANT
GLOVE BIOGEL PI IND STRL 8 (GLOVE) IMPLANT
GOWN STRL REUS W/ TWL LRG LVL3 (GOWN DISPOSABLE) ×2 IMPLANT
GOWN STRL REUS W/ TWL XL LVL3 (GOWN DISPOSABLE) IMPLANT
HEMOSTAT ARISTA ABSORB 3G PWDR (HEMOSTASIS) IMPLANT
LINER CANISTER 1000CC FLEX (MISCELLANEOUS) ×1 IMPLANT
NDL FILTER BLUNT 18X1 1/2 (NEEDLE) IMPLANT
NDL HYPO 25X1 1.5 SAFETY (NEEDLE) ×2 IMPLANT
NEEDLE FILTER BLUNT 18X1 1/2 (NEEDLE) ×1 IMPLANT
NEEDLE HYPO 25X1 1.5 SAFETY (NEEDLE) ×2 IMPLANT
NS IRRIG 1000ML POUR BTL (IV SOLUTION) IMPLANT
PACK BASIN DAY SURGERY FS (CUSTOM PROCEDURE TRAY) ×1 IMPLANT
PAD ALCOHOL SWAB (MISCELLANEOUS) IMPLANT
PAD FOAM SILICONE BACKED (GAUZE/BANDAGES/DRESSINGS) IMPLANT
PENCIL SMOKE EVACUATOR (MISCELLANEOUS) ×1 IMPLANT
PIN SAFETY STERILE (MISCELLANEOUS) IMPLANT
POWDER MYRIAD MORCLLS FINE 500 (Miscellaneous) IMPLANT
SLEEVE SCD COMPRESS KNEE MED (STOCKING) ×1 IMPLANT
SPIKE FLUID TRANSFER (MISCELLANEOUS) IMPLANT
SPONGE T-LAP 18X18 ~~LOC~~+RFID (SPONGE) ×2 IMPLANT
STRIP SUTURE WOUND CLOSURE 1/2 (MISCELLANEOUS) ×4 IMPLANT
SUT MNCRL AB 4-0 PS2 18 (SUTURE) ×4 IMPLANT
SUT MON AB 3-0 SH27 (SUTURE) ×4 IMPLANT
SUT MON AB 5-0 PS2 18 (SUTURE) IMPLANT
SUT PDS 3-0 CT2 (SUTURE) ×6 IMPLANT
SUT PDS II 3-0 CT2 27 ABS (SUTURE) ×4 IMPLANT
SUT SILK 3 0 PS 1 (SUTURE) IMPLANT
SUT SILK 3 0 SH 30 (SUTURE) IMPLANT
SUT VIC AB 5-0 PS2 18 (SUTURE) IMPLANT
SYR 10ML LL (SYRINGE) IMPLANT
SYR 50ML LL SCALE MARK (SYRINGE) IMPLANT
SYR BULB IRRIG 60ML STRL (SYRINGE) ×1 IMPLANT
SYR CONTROL 10ML LL (SYRINGE) ×2 IMPLANT
TAPE MEASURE VINYL STERILE (MISCELLANEOUS) IMPLANT
TOWEL GREEN STERILE FF (TOWEL DISPOSABLE) ×3 IMPLANT
TRAY DSU PREP LF (CUSTOM PROCEDURE TRAY) ×1 IMPLANT
TUBE CONNECTING 20X1/4 (TUBING) ×1 IMPLANT
TUBING INFILTRATION IT-10001 (TUBING) IMPLANT
TUBING SET GRADUATE ASPIR 12FT (MISCELLANEOUS) IMPLANT
UNDERPAD 30X36 HEAVY ABSORB (UNDERPADS AND DIAPERS) ×2 IMPLANT
YANKAUER SUCT BULB TIP NO VENT (SUCTIONS) ×1 IMPLANT

## 2024-03-09 NOTE — Discharge Instructions (Addendum)
 INSTRUCTIONS FOR AFTER BREAST SURGERY   You will likely have some questions about what to expect following your operation.  The following information will help you and your family understand what to expect when you are discharged from the hospital.  It is important to follow these guidelines to help ensure a smooth recovery and reduce complication.  Postoperative instructions include information on: diet, wound care, medications and physical activity.  AFTER SURGERY Expect to go home after the procedure.  In some cases, you may need to spend one night in the hospital for observation.  DIET Breast surgery does not require a specific diet.  However, the healthier you eat the better your body will heal. It is important to increasing your protein intake.  This means limiting the foods with sugar and carbohydrates.  Focus on vegetables and some meat.  If you have liposuction during your procedure be sure to drink water.  If your urine is bright yellow, then it is concentrated, and you need to drink more water.  As a general rule after surgery, you should have 8 ounces of water every hour while awake.  If you find you are persistently nauseated or unable to take in liquids let us  know.  NO TOBACCO USE or EXPOSURE.  This will slow your healing process and lead to a wound.  WOUND CARE Leave the binder on for 3 days . Use fragrance free soap like Dial, Dove or [place].   After 3 days you can remove the binder to shower. Once dry apply binder or sports bra. If you have liposuction you will have a soft and spongy dressing (Lipofoam) that helps prevent creases in your skin.  Remove before you shower and then replace it.  It is also available on [employer]. If you have steri-strips / tape directly attached to your skin leave them in place. It is OK to get these wet.   No baths, pools or hot tubs for four weeks. We close your incision to leave the smallest and best-looking scar. No ointment or creams on your incisions  for four weeks.  No Neosporin (Too many skin reactions).  A few weeks after surgery you can use Mederma and start massaging the scar. We ask you to wear your binder or sports bra for the first 6 weeks around the clock, including while sleeping. This provides added comfort and helps reduce the fluid accumulation at the surgery site. NO Ice or heating pads to the operative site.  You have a very high risk of a BURN before you feel the temperature change.   Per Anastacio Balm PA do not get the nipple bolsters wet. Please spot bathe until seen in the office. The office will reach out to you with a sooner appt.  ACTIVITY No heavy lifting until cleared by the doctor.  This usually means no more than a half-gallon of milk.  It is OK to walk and climb stairs. Moving your legs is very important to decrease your risk of a blood clot.  It will also help keep you from getting deconditioned.  Every 1 to 2 hours get up and walk for 5 minutes. This will help with a quicker recovery back to normal.  Let pain be your guide so you don't do too much.  This time is for you to recover.  You will be more comfortable if you sleep and rest with your head elevated either with a few pillows under you or in a recliner.  No stomach sleeping for a  three months.  WORK Everyone returns to work at different times. As a rough guide, most people take at least 1 - 2 weeks off prior to returning to work. If you need documentation for your job, give the forms to the front staff at the clinic.  DRIVING Arrange for someone to bring you home from the hospital after your surgery.  You may be able to drive a few days after surgery but not while taking any narcotics or valium.  BOWEL MOVEMENTS Constipation can occur after anesthesia and while taking pain medication.  It is important to stay ahead for your comfort.  We recommend taking Milk of Magnesia (2 tablespoons; twice a day) while taking the pain pills.  MEDICATIONS You may be prescribed  should start after surgery At your preoperative visit for you history and physical you may have been given the following medications: An antibiotic: Start this medication when you get home and take according to the instructions on the bottle. Zofran  4 mg:  This is to treat nausea and vomiting.  You can take this every 6 hours as needed and only if needed. Valium 2 mg for breast cancer patients: This is for muscle tightness if you have an implant or expander. This will help relax your muscle which also helps with pain control.  This can be taken every 12 hours as needed. Don't drive after taking this medication. Norco (hydrocodone /acetaminophen ) 5/325 mg:  This is only to be used after you have taken the Motrin  or the Tylenol . Every 8 hours as needed.   Over the counter Medication to take: Ibuprofen  (Motrin ) 600 mg:  Take this every 6 hours.  If you have additional pain then take 500 mg of the Tylenol  every 8 hours.  Only take the Norco after you have tried these two. MiraLAX or Milk of Magnesia: Take this according to the bottle if you take the Norco.  WHEN TO CALL Call your surgeon's office if any of the following occur: Fever 101 degrees F or greater Excessive bleeding or fluid from the incision site. Pain that increases over time without aid from the medications Redness, warmth, or pus draining from incision sites Persistent nausea or inability to take in liquids Severe misshapen area that underwent the operation.   No Tylenol  before 1:45pm. No ibuprofen  before 3:45pm. Patient received oxycodone 5mg  today at 2:10 PM   Post Anesthesia Home Care Instructions  Activity: Get plenty of rest for the remainder of the day. A responsible individual must stay with you for 24 hours following the procedure.  For the next 24 hours, DO NOT: -Drive a car -Advertising copywriter -Drink alcoholic beverages -Take any medication unless instructed by your physician -Make any legal decisions or sign  important papers.  Meals: Start with liquid foods such as gelatin or soup. Progress to regular foods as tolerated. Avoid greasy, spicy, heavy foods. If nausea and/or vomiting occur, drink only clear liquids until the nausea and/or vomiting subsides. Call your physician if vomiting continues.  Special Instructions/Symptoms: Your throat may feel dry or sore from the anesthesia or the breathing tube placed in your throat during surgery. If this causes discomfort, gargle with warm salt water. The discomfort should disappear within 24 hours.  If you had a scopolamine patch placed behind your ear for the management of post- operative nausea and/or vomiting:  1. The medication in the patch is effective for 72 hours, after which it should be removed.  Wrap patch in a tissue and discard in the  trash. Wash hands thoroughly with soap and water. 2. You may remove the patch earlier than 72 hours if you experience unpleasant side effects which may include dry mouth, dizziness or visual disturbances. 3. Avoid touching the patch. Wash your hands with soap and water after contact with the patch.

## 2024-03-09 NOTE — Interval H&P Note (Signed)
 History and Physical Interval Note:  03/09/2024 8:18 AM  Stacey Santos  has presented today for surgery, with the diagnosis of Hypertrophy of breast.  The various methods of treatment have been discussed with the patient and family. After consideration of risks, benefits and other options for treatment, the patient has consented to  Procedure(s): BREAST REDUCTION WITH LIPOSUCTION (Bilateral) as a surgical intervention.  The patient's history has been reviewed, patient examined, no change in status, stable for surgery.  I have reviewed the patient's chart and labs.  Questions were answered to the patient's satisfaction.     Lindaann Requena Sophiea Ueda

## 2024-03-09 NOTE — Transfer of Care (Signed)
 Immediate Anesthesia Transfer of Care Note  Patient: Stacey Santos  Procedure(s) Performed: BREAST REDUCTION WITH LIPOSUCTION (Bilateral: Breast)  Patient Location: PACU  Anesthesia Type:General  Level of Consciousness: awake, alert , oriented, drowsy, and patient cooperative  Airway & Oxygen Therapy: Patient Spontanous Breathing and Patient connected to face mask oxygen  Post-op Assessment: Report given to RN and Post -op Vital signs reviewed and stable  Post vital signs: Reviewed and stable  Last Vitals:  Vitals Value Taken Time  BP    Temp    Pulse 100 03/09/24 1149  Resp    SpO2 94 % 03/09/24 1149  Vitals shown include unfiled device data.  Last Pain:  Vitals:   03/09/24 0737  TempSrc: Oral  PainSc: 0-No pain      Patients Stated Pain Goal: 8 (03/09/24 0737)  Complications: No notable events documented.

## 2024-03-09 NOTE — Anesthesia Postprocedure Evaluation (Signed)
 Anesthesia Post Note  Patient: Stacey Santos  Procedure(s) Performed: BREAST REDUCTION WITH LIPOSUCTION (Bilateral: Breast)     Patient location during evaluation: PACU Anesthesia Type: General Level of consciousness: awake and alert Pain management: pain level controlled Vital Signs Assessment: post-procedure vital signs reviewed and stable Respiratory status: spontaneous breathing, nonlabored ventilation and respiratory function stable Cardiovascular status: stable and blood pressure returned to baseline Anesthetic complications: no  No notable events documented.  Last Vitals:  Vitals:   03/09/24 1246 03/09/24 1300  BP:  104/68  Pulse:  92  Resp:  12  Temp:    SpO2: 95% 93%    Last Pain:  Vitals:   03/09/24 1300  TempSrc:   PainSc: Asleep                 Juventino Oppenheim

## 2024-03-09 NOTE — Anesthesia Procedure Notes (Signed)
 Procedure Name: LMA Insertion Date/Time: 03/09/2024 8:41 AM  Performed by: Eugenia Hess, CRNAPre-anesthesia Checklist: Patient identified, Emergency Drugs available, Suction available and Patient being monitored Patient Re-evaluated:Patient Re-evaluated prior to induction Oxygen Delivery Method: Circle System Utilized Preoxygenation: Pre-oxygenation with 100% oxygen Induction Type: IV induction Ventilation: Mask ventilation without difficulty LMA: LMA inserted LMA Size: 4.0 Number of attempts: 1 Airway Equipment and Method: bite block Placement Confirmation: positive ETCO2 Tube secured with: Tape Dental Injury: Teeth and Oropharynx as per pre-operative assessment

## 2024-03-09 NOTE — Op Note (Signed)
 Breast Reduction Op note:    DATE OF PROCEDURE: 03/09/2024  LOCATION: [facility]  SURGEON: Gilles Lacks, DO  ASSISTANT: Marvell Slider, PA  PREOPERATIVE DIAGNOSIS 1. Macromastia 2. Neck Pain 3. Back Pain  POSTOPERATIVE DIAGNOSIS 1. Macromastia 2. Neck Pain 3. Back Pain  PROCEDURES 1. Bilateral breast reduction.  Right reduction 2100 g, Left reduction 2150 g  COMPLICATIONS: None.  DRAINS: bilateral  INDICATIONS FOR PROCEDURE Stacey Santos is a 58 y.o. year-old female born on Mar 16, 1966,with a history of symptomatic macromastia with concomitant back pain, neck pain, shoulder grooving from her bra.   MRN: [id number]  CONSENT Informed consent was obtained directly from the patient. The risks, benefits and alternatives were fully discussed. Specific risks including but not limited to bleeding, infection, hematoma, seroma, scarring, pain, nipple necrosis, asymmetry, poor cosmetic results, and need for further surgery were discussed. The patient's questions were answered.  DESCRIPTION OF PROCEDURE  Patient was brought into the operating room and rested on the operating room table in the supine position.  SCDs were placed and appropriate padding was performed.  Antibiotics were given. The patient underwent general anesthesia and the chest was prepped and draped in a sterile fashion.  A timeout was performed and all information was confirmed to be correct by those in the room. Tumescent was placed in the lateral breast on each side.  The liposuction was done on each breast laterally for improved contour.  Right side: Preoperative markings were confirmed.  Incision lines were injected with local containing epinephrine.  After waiting for vasoconstriction, the marked lines were incised with a #15 blade.  A Wise-pattern amputation breast reduction was performed by de-epithelializing the pedicle, using bovie to create the breast mound and removing breast tissue  from the inferior portion of the breast.  The nipple areola was removed as an amputation full thickness skin graft. The areola was then placed in the pre-marked position and sutured into place with the 5-0 Vicryl followed by the 4-0 Monocryl.  A Xeroform bolster was applied and secured with the Vicryl.  Hemostasis was achieved.  Experel and Myriad were placed in the pocket. The nipple was gently rotated into position and the soft tissue closed with 4-0 Monocryl.   The pocket was irrigated and hemostasis confirmed.  A drain was placed and secured to the skin with the 3-0 Silk. The deep tissues were approximated with 3-0 PDS sutures.  The skin was closed with deep dermal 3-0 Monocryl and subcuticular 4-0 Monocryl sutures.  The nipple and skin flaps had good capillary refill at the end of the procedure.    Left side: Preoperative markings were confirmed.  Incision lines were injected with local containing epinephrine.  After waiting for vasoconstriction, the marked lines were incised with a #15 blade.  A Wise-pattern amputation breast reduction was performed by de-epithelializing the pedicle, using bovie to create the breast mound, and removing breast tissue from the inferior portion of the breast.  The nipple areola was removed as a full thickness skin graft. The areola was then placed in the pre-marked position and sutured into place with the 5-0 Vicryl followed by the 4-0 Monocryl.  A Xeroform bolster was applied and secured with the Vicryl.Hemostasis was achieved. Experel and Myriad were placed in the pocket.  The nipple was gently rotated into position and the soft tissue was closed with 4-0 Monocryl.  The patient was sat upright and size and shape symmetry was confirmed.  The pocket was irrigated and hemostasis confirmed.  A drain was placed and secured to the skin with the 3-0 Silk.   The deep tissues were approximated with 3-0 PDS sutures. The skin was closed with deep dermal 3-0 Monocryl and subcuticular 4-0  Monocryl sutures.  Dermabond was applied.  A breast binder and ABDs were placed.  The nipple and skin flaps had good capillary refill at the end of the procedure.  The patient tolerated the procedure well. The patient was allowed to wake from anesthesia and taken to the recovery room in satisfactory condition.  The advanced practice practitioner (APP) assisted throughout the case.  The APP was essential in retraction and counter traction when needed to make the case progress smoothly.  This retraction and assistance made it possible to see the tissue plans for the procedure.  The assistance was needed for blood control, tissue re-approximation and assisted with closure of the incision site.

## 2024-03-10 ENCOUNTER — Telehealth: Payer: Self-pay | Admitting: Plastic Surgery

## 2024-03-10 ENCOUNTER — Encounter: Payer: Self-pay | Admitting: Physician Assistant

## 2024-03-10 ENCOUNTER — Other Ambulatory Visit: Payer: Self-pay | Admitting: Student

## 2024-03-10 ENCOUNTER — Encounter (HOSPITAL_BASED_OUTPATIENT_CLINIC_OR_DEPARTMENT_OTHER): Payer: Self-pay | Admitting: Plastic Surgery

## 2024-03-10 MED ORDER — HYDROCODONE-ACETAMINOPHEN 5-325 MG PO TABS: 1.0000 | ORAL_TABLET | Freq: Four times a day (QID) | ORAL | 0 refills | Status: AC | PRN

## 2024-03-10 NOTE — Telephone Encounter (Signed)
 Attempted to call patient, she did  not answer and it would not let me leave a voicemail. Per preop note, patient currently takes tramadol at home for chronic pain. She may take that if tylenol  and ibuprofen  are not covering her pain. Otherwise, she will need to reach out to her pain management provider as discussed at preop visit.

## 2024-03-10 NOTE — Progress Notes (Signed)
 Patient called stating that she did not receive pain medications for her after her procedure.  I discussed with the patient that we discussed at her preoperative appointment that she needed to reach out to her pain management provider for any supplemental medications.  She did state that she reached out to her pain management provider, Scientist, forensic, FNP, who stated that patient needed to receive postoperative pain medication from us .  I discussed with the patient that I can send her in Norco 5 mg.  I emphasized the importance of not taking this at the same time as her other Norco prescription and as her tramadol as these can be sedating.  I told her that she should always take Tylenol  or ibuprofen  first for her pain before she takes a pain pill.  Patient expressed understanding.  Patient states that otherwise she is doing well.  All of her questions were answered to her satisfaction.

## 2024-03-10 NOTE — Telephone Encounter (Signed)
 Patient say that she never received her pain medication she had surgery on 03-10-2024, please reach out

## 2024-03-11 LAB — SURGICAL PATHOLOGY

## 2024-03-16 NOTE — Telephone Encounter (Signed)
 Attempted to call patient again. No answer. Unable to leave vmail.

## 2024-03-17 NOTE — Telephone Encounter (Signed)
Attempted to call pt NA NVM

## 2024-03-18 ENCOUNTER — Telehealth: Payer: Self-pay

## 2024-03-18 ENCOUNTER — Ambulatory Visit: Admitting: Student

## 2024-03-18 ENCOUNTER — Telehealth: Payer: Self-pay | Admitting: *Deleted

## 2024-03-18 ENCOUNTER — Encounter: Payer: Self-pay | Admitting: Student

## 2024-03-18 VITALS — BP 121/79 | HR 96 | Ht 62.0 in | Wt 230.6 lb

## 2024-03-18 DIAGNOSIS — N62 Hypertrophy of breast: Secondary | ICD-10-CM

## 2024-03-18 MED ORDER — METHOCARBAMOL 500 MG PO TABS: 500.0000 mg | ORAL_TABLET | Freq: Three times a day (TID) | ORAL | 0 refills | Status: AC | PRN

## 2024-03-18 NOTE — Telephone Encounter (Signed)
 Attempted to call the patient to explain the plan per Anastacio Balm regarding her pain medications. There was no answer and no way to leave a voicemail.

## 2024-03-18 NOTE — Telephone Encounter (Signed)
 Joslyn from [facility] returned my call regarding pain medication refill. She stated Dr. Erman Hayward stated it was ok to refill the Hydrocodone . Passed information to [place].

## 2024-03-18 NOTE — Telephone Encounter (Signed)
Faxed order,demographics,insurance information,and recent office notes to [facility] for supplies for the patient.  Confirmation received and copy scanned into the chart.//AB/CMA

## 2024-03-18 NOTE — Progress Notes (Signed)
 Patient is a 58 year old female with history of macromastia.  She recently underwent bilateral breast reduction using amputation technique with Dr. Orin Birk on 03/09/2024.  Intraoperatively, patient had 2100 g removed from the right breast and 2150 g removed from the left breast.  Patient is a little bit over 1 week postop and she presents to the clinic today for postoperative follow-up.  Today, patient presents with her husband at bedside.  She reports that overall she has been doing pretty well.  She does report some soreness to the lateral breast.  Patient also reports that her shoulders have been hurting a little bit, more so on the right than the left.  She denies any fevers or chills.  She denies any chest pain or shortness of breath.  She denies any nausea or vomiting.  She is reports that she has been up and ambulating.  She reports she has been eating and drinking without any issue.  She also states that her drain output has been pretty minimal, about 10 cc out from the drains yesterday, none the day prior and about 10 cc the day before that.  Vitals:   03/18/24 0820  BP: 121/79  Pulse: 96  SpO2: 95%  Patient is afebrile and her vitals are stable  Chaperone present on exam.  On exam, patient is sitting upright in no acute distress.  Breathing is unlabored and patient is speaking in full and clear sentences.  Breast are soft and symmetric.  There is no overlying erythema.  No obvious fluid collections palpated on exam.  Bolsters are in place over the NAC's bilaterally.  Bolsters were removed.  Nipples appear to be well adhered with some mild superficial epidermal lysis noted.  Mepilex border dressings appear to be clean dry and intact.  Some of the Steri-Strips appear to have some drainage on them.  Steri-Strips were removed and incisions appear to be clean dry and intact.  Steri-Strips were replaced as well as the Mepilex border dressing.  JP drains are intact and functioning with very minimal  serous drainage in the bulbs bilaterally.  There are no signs of infection on exam.  Pain to the right shoulder is reproducible with palpation.  There is no overlying erythema or skin changes.  Arms appear to be symmetric in size.  JP drains were removed without any difficulty.  Patient tolerated well.  I discussed with the patient that I suspect her pain to her shoulders could be a muscle spasm, possibly from guarding after her procedure.  I recommended that she make sure she is doing some light range of motion with her upper extremities.  I will also go ahead and send in a muscle relaxer for her.  I discussed with her to not take this at the same time as any of her pain medications as these can be sedating.  Patient expressed understanding.  I discussed with her that if her pain worsens or if she starts developing any other symptoms she needs to go to the emergency room.  Patient expressed understanding.  Discussed with patient that I would like her to apply Xeroform and dressings over her NAC's daily.  I discussed with her that if she runs out of Xeroform she can either order more or she can apply a thick layer of Vaseline and dressings over the top daily.  We will try to order the Xeroform through [facility]  though.  Patient expressed understanding.  I also discussed with the patient that I would like her to change  the dressing over the drain site daily or as needed for saturation.  Patient expressed understanding.  Discussed with the patient to wear compression at all times.  Patient expressed understanding.  Patient also inquired about possibly receiving more pain medications.  I discussed with her that I would like her to take Tylenol  and ibuprofen  for her pain.  Discussed with her that sending more pain medications could put her at risk of breaking contract with her pain management provider.  We will reach out to her pain management provider to notify them. Patient expressed understanding.  Per PDMP  review, it appears patient filled her prescription for 30 tablets of tramadol on 03/11/2024.  Patient to follow back up at her neck scheduled appointment.  I instructed her to call in the meantime she has any questions or concerns about anything.

## 2024-03-29 ENCOUNTER — Ambulatory Visit (INDEPENDENT_AMBULATORY_CARE_PROVIDER_SITE_OTHER): Admitting: Plastic Surgery

## 2024-03-29 ENCOUNTER — Encounter: Payer: Self-pay | Admitting: Plastic Surgery

## 2024-03-29 DIAGNOSIS — N62 Hypertrophy of breast: Secondary | ICD-10-CM

## 2024-03-29 MED ORDER — KETOROLAC TROMETHAMINE 10 MG PO TABS: 10.0000 mg | ORAL_TABLET | Freq: Three times a day (TID) | ORAL | 0 refills | Status: AC | PRN

## 2024-03-29 NOTE — Progress Notes (Signed)
 The patient is a 59 year old female here for follow-up after undergoing bilateral breast reduction on May 7.  She had 2100 g removed from both breasts.  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  The patient is feeling better but still has some neck and shoulder pain.  I will send in some Toradol to see if we can break the cycle of that pain.  I changed her dressings.  She should use Vashe and Xeroform.  She has a little bit of opening on the left breast at the inferior portion of the vertical limb.

## 2024-03-31 ENCOUNTER — Telehealth: Payer: Self-pay | Admitting: Plastic Surgery

## 2024-03-31 NOTE — Telephone Encounter (Signed)
 Attempted to call patient, she did not answer. I am more than happy to see her tomorrow afternoon at 1:40 or 2:00

## 2024-03-31 NOTE — Telephone Encounter (Signed)
 Patient is experiencing some redness and wound looks spread apart, it looks like a hole has formed. Patients husband took photos as well, please reach out and advise

## 2024-04-01 ENCOUNTER — Encounter: Payer: Self-pay | Admitting: Student

## 2024-04-01 ENCOUNTER — Ambulatory Visit: Admitting: Student

## 2024-04-01 VITALS — BP 113/75 | HR 93 | Ht 62.0 in | Wt 233.0 lb

## 2024-04-01 DIAGNOSIS — N62 Hypertrophy of breast: Secondary | ICD-10-CM

## 2024-04-01 MED ORDER — CEPHALEXIN 500 MG PO CAPS: 500.0000 mg | ORAL_CAPSULE | Freq: Four times a day (QID) | ORAL | 0 refills | Status: AC

## 2024-04-01 NOTE — Progress Notes (Signed)
 Patient is a 58 year old female with history of macromastia. She recently underwent bilateral breast reduction using amputation technique with Dr. Orin Birk on 03/09/2024.  Patient is about 3 weeks postop.  She presents the clinic today with concerns about redness and a wound.  Patient was last seen in the clinic on 03/29/2024.  At this visit, it was noted that the nipple areolas had lost their pigment, but that this would likely come back.  Toradol was sent in to see if cycle of pain could be broken.  It was recommended that patient use Vashe and Xeroform.  Patient did have a little bit of an opening to the left breast at the inferior portion of the vertical limb.  Today, patient presents with her husband at bedside.  She reports that she is doing okay.  She states that her husband noticed that her wound to her left breast was opening up a little bit and was concerned.  She states that he also thought he may have noticed a little bit of redness out laterally to the left breast.  Patient reports that she is having some tenderness to the left lateral breast.  She denies any fevers or chills.  She denies any drainage from either of her breasts.  She states that she is still having shoulder pain here and there, but it has been improving.  She states that she was prescribed Toradol by Dr. Orin Birk, but has not taken it yet.  Chaperone present on exam.  On exam, patient is sitting upright in no acute distress.  Breasts are overall fairly symmetric and overall soft.  There is some firmness noted to the medial and lateral aspects of the right breast consistent with fat necrosis versus scar tissue.  There is also some firmness noted out laterally to the left breast, consistent with scar tissue versus fat necrosis.  There are no overlying skin changes.  There is no overlying erythema to either breast.  There is some mild tenderness to palpation to the left lateral breast.  There are no fluid collections palpated on  exam.  There is healthy appearing tissue to both of the NAC's.  To the left breast, there is an approximately 3 cm x 2 cm wound noted to the inferior T-zone.  There is exudate noted throughout the wound.  There is no active drainage or surrounding erythema.  There is also a small superficial wound noted to the left lateral breast incision.  There is exudate noted throughout the wound.  There is no surrounding erythema no active drainage noted.  I discussed with the patient that there are no overt signs of infection on exam, but given the point tenderness she is having to the left side, I did offer her antibiotics if she like to be covered in the event that this is the start of an infection.  Patient stated that she would like to move forward with antibiotics.  I did tell her to keep a close eye on the area.  I discussed with her that if it becomes more painful, red, swollen, or if she develops any fevers, chills or has any concerns she needs to let us  know immediately.  Patient expressed understanding.  I discussed with the patient that the wounds have some exudate throughout them.  I did discuss with her that there is the possibility it could get a little bit bigger and opened up a little bit more as she is doing dressing changes.  I recommended that she continue with dressing  changes as she has been, cleaning with Vashe and applying Vaseline and Xeroform.  I also recommended she continue to do that to her nipples as well.  Patient expressed understanding.  I recommended that patient gently massage the areas of firmness to her breasts.  I discussed with her that I feel like this will help with her pain.  I also recommended that she begin gentle range of motion of her upper extremities to help with some of her shoulder pain.  I discussed with her that if her shoulder pain continues, we may have to consider physical therapy.  Patient expressed understanding.  I discussed with the patient that if she is going  to start taking the Toradol, she cannot take any other NSAIDs with it.  Patient denied any history of gastric ulcers, clotting disorders or kidney disease.  I would like the patient to follow back up next week.  I instructed her to call if she has any questions or concerns about anything.

## 2024-04-05 ENCOUNTER — Ambulatory Visit (INDEPENDENT_AMBULATORY_CARE_PROVIDER_SITE_OTHER): Admitting: Student

## 2024-04-05 ENCOUNTER — Encounter: Admitting: Student

## 2024-04-05 DIAGNOSIS — N62 Hypertrophy of breast: Secondary | ICD-10-CM

## 2024-04-05 NOTE — Progress Notes (Signed)
 Patient is a 58 year old female with history of macromastia. She recently underwent bilateral breast reduction using amputation technique with Dr. Orin Birk on 03/09/2024.  Patient is about 4 weeks postop.  She presents to the clinic today for postoperative follow-up.  Patient was last seen in the clinic on 04/01/2024.  At this visit, patient had noted that her breast wound was opening up and was concerned about it.  On exam, breasts were soft and overall fairly symmetric.  There was some firmness noted to the medial and lateral aspects of the right breast and a little bit to the left breast.  To the left breast, there is an approximately 3 cm x 2 cm wound noted to the inferior T-zone with exudate throughout.  There is also a small superficial wound noted to the left lateral breast incision.  Patient was started on Keflex  to cover for the possibility of infection.  Today, patient reports she is doing well.  She states that she still has a little bit of soreness to her left lateral breast, but feels that it has been improving.  She denies any fevers or chills since she was seen in the clinic.  She denies any drainage from her wounds.  She reports that she has been doing her daily wound care and massaging her breasts and the areas of firmness.  She reports that her shoulder still hurts a little bit, but feels improvement with the Toradol .  Chaperone present on exam.  On exam, patient is sitting upright in no acute distress.  Breasts are overall fairly soft and symmetric.  There are still some areas of firmness noted to the right breast consistent with scar tissue versus fat necrosis and still little bit of firmness to the left lateral breast consistent with scar tissue, but it does feel softened up since previous exam.  There is no overlying erythema to either breast.  No obvious fluid collections palpated on exam.  NAC's appear to be healing well.  There is some slough/exudate noted to the left NAC.  There is  hypopigmentation throughout both of the NAC's.  Wound to the left breast is similar in appearance to her previous exam with exudate noted throughout the wound, no active drainage and no surrounding erythema.  There is still a superficial wound noted to the left inframammary incision.  Incisions are otherwise intact and healing well.  There are no obvious signs of infection on exam.  Discussed with patient that there are no obvious signs of infection on exam, but recommended that she finish her course of antibiotics.  I recommended that she continue with her wound care/dressing changes as she has been.  Recommended that she continue with Vaseline to her incisions daily as well.  Patient expressed understanding.  Recommended that patient continue to massage her breasts/areas of firmness 1-2 times daily.  Patient expressed understanding.  Encourage patient to continue with gentle range of motion of her upper extremities.  Discussed with her that if she is still experiencing shoulder pain in about a week or so, we should consider physical therapy at that time.  Patient expressed understanding.  Patient to follow back up next week.  I instructed her to call in the meantime if she has any questions or concerns about anything.

## 2024-04-07 ENCOUNTER — Encounter: Admitting: Student

## 2024-04-12 ENCOUNTER — Ambulatory Visit: Admitting: Student

## 2024-04-12 DIAGNOSIS — N62 Hypertrophy of breast: Secondary | ICD-10-CM

## 2024-04-12 NOTE — Progress Notes (Signed)
 Patient is a 58 year old female with history of macromastia. She recently underwent bilateral breast reduction using amputation technique with Dr. Orin Birk on 03/09/2024.  She is almost 5 weeks postop. She presents to the clinic today for postoperative follow up.      Patient was last seen in the clinic on 04/05/2024.  At this visit, patient was doing well.  On exam, breasts were overall soft and symmetric.  There is still some areas of firmness noted to the right breast consistent with scar tissue versus fat necrosis and still little bit of firmness to the left lateral breast consistent with scar tissue.  There is no overlying erythema to either breast.  NAC's appear to be healing well.  There was some hypopigmentation noted throughout both of the NAC's.  Wound to the left breast appear to be similar to the previous exam with exudate noted throughout the wound.  There was also superficial wound noted to the left inframammary incision.  Today, patient reports she is doing well.  She presents with her husband at bedside.  She states that the knots are still present to her breasts, but she has been massaging.  She states that the pain to her shoulder has been improving.  She denies any fevers or chills.  She denies any drainage from her breasts.  She reports that she has been doing the prescribed wound care daily without any difficulty.  She does not have any other issues or concerns today.  Chaperone present on exam.  On exam, patient is sitting upright in no acute distress.  Breasts are overall soft and fairly symmetric.  There is still some areas of firmness noted to the right breast consistent with scar tissue versus fat necrosis and some firmness to the left breast, consistent with some scar tissue that does appear to be improved from previous exam.  There is no overlying erythema.  No obvious fluid collections palpated on exam.  NAC's appear to be healing well with improved epithelialization.  Wound to the  left inferior T-zone appears to be slightly improved from previous exam.  There is still exudate throughout the wound.  There is still a superficial wound noted to the left lateral inframammary incision with a small amount of exudate noted.  Remainder of the incisions appear to be clean dry and intact.  There are no signs of infection on exam.  Recommended that patient continue with her wound care and continue massaging the areas of firmness to each of her breasts.  Recommended that she continue with compression at all times as well as gentle range of motion of her upper extremities.  We will plan to see the patient back in about 2 weeks.  I instructed her to call if she has any questions or concerns about anything.  Pictures were obtained of the patient and placed in the chart with the patient's or guardian's permission.

## 2024-04-25 NOTE — Progress Notes (Unsigned)
 Patient is a 58 year old female with history of macromastia. She recently underwent bilateral breast reduction using amputation technique with Dr. Lowery on 03/09/2024.  Patient is almost 7 weeks out from surgery.  She presents to the clinic today for postoperative follow-up.  Patient was last seen in the clinic on 04/12/2024.  At this visit, patient was doing well.  On exam, breasts were overall soft and symmetric.  There were some areas of firmness noted to the right breast consistent with scar tissue versus fat necrosis as well as to the left breast.  NAC's were healing well with improved epithelialization.  There was a wound to the inferior T-zone on the left side which appeared to be improving and a superficial wound noted to the left lateral inframammary incision.  Today,

## 2024-04-26 ENCOUNTER — Encounter: Payer: Self-pay | Admitting: Student

## 2024-04-26 ENCOUNTER — Ambulatory Visit (INDEPENDENT_AMBULATORY_CARE_PROVIDER_SITE_OTHER): Admitting: Student

## 2024-04-26 VITALS — BP 122/81 | HR 95 | Ht 62.0 in | Wt 230.0 lb

## 2024-04-26 DIAGNOSIS — N62 Hypertrophy of breast: Secondary | ICD-10-CM

## 2024-05-18 ENCOUNTER — Encounter: Payer: Self-pay | Admitting: Student

## 2024-05-18 ENCOUNTER — Ambulatory Visit: Admitting: Student

## 2024-05-18 VITALS — BP 121/75 | HR 80

## 2024-05-18 DIAGNOSIS — N62 Hypertrophy of breast: Secondary | ICD-10-CM

## 2024-05-18 NOTE — Progress Notes (Signed)
 Patient is a 58 year old female with history of macromastia. She recently underwent bilateral breast reduction using amputation technique with Dr. Lowery on 03/09/2024.  She is a little over 2 months postop.  She presents to the clinic today for postoperative follow-up.  Patient was last seen in the clinic on 04/26/2024.  At this visit, patient was doing well.  On exam, breasts were soft and symmetric.  There is still little bit of firmness noted to the right lateral breast consistent with fat necrosis versus scar tissue.  Wound to the inferior T-zone appears to have gotten smaller, still exudate noted in the wound.  Wound to the left inframammary had also gotten smaller, still a small amount of exudate within the wound.  Today, patient reports she is doing well.  She states that she sometimes has little pains to her left breast.  She otherwise reports that she has been massaging her breasts and feels that her wounds are improving.  She denies any other issues or concerns at this time.  She does not report any fevers or chills.  Chaperone present on exam.  On exam, patient is sitting upright in no acute distress.  Breasts are overall soft and symmetric.  There is a little bit of firmness noted to the lateral aspects of the breast bilaterally.  This appears to be consistent with fat necrosis versus scar tissue.  There are no overlying skin changes.  Right NAC appears to be healing nicely with some of the pigment coming back.  The left NAC does still have a small wound noted to it, but majority of the left NAC appears to be well-healed with pigment starting to come back.  The wound to the inferior left T-zone appears to have significantly improved.  There is exudate noted throughout the wound.  The wound to the left lateral inframammary incision appears to have completely healed.  Incisions are otherwise well-healed throughout.  There are no signs of infection on exam.  Recommended that patient continue with  Vaseline daily to her left NAC and she may continue with Xeroform to her left inferior T-zone wound daily.  Recommended that she continue with mechanical massage of the areas of firmness.  Patient expressed understanding.  Discussed with the patient that she may start scar creams to her right breast if she would like.  We discussed Mederma, silicone-based scar creams and tapes.  Patient expressed understanding.  We will plan to see the patient back in about 1 month.  I instructed her to call if she has any questions or concerns in the meantime.

## 2024-06-09 ENCOUNTER — Ambulatory Visit: Admitting: Student

## 2024-06-09 VITALS — BP 121/83 | HR 94

## 2024-06-09 DIAGNOSIS — N62 Hypertrophy of breast: Secondary | ICD-10-CM

## 2024-06-09 NOTE — Progress Notes (Signed)
 Patient is a 58 year old female with history of macromastia.  She underwent bilateral breast reduction using amputation technique with Dr. Lowery on 03/09/2024.  Patient is approximately 3 months postop.  She presents to the clinic today for postoperative follow-up.  Patient was last seen in the clinic on 05/18/2024.  At this visit, patient was doing well.  On exam, breasts were overall soft and symmetric.  There was a little bit of firmness noted to the lateral aspects of the breast bilaterally consistent with fat necrosis versus scar tissue.  Right NAC appeared to have been healing nicely with some of the pigment coming back.  The left NAC still had a small wound noted to it but the majority of the left NAC appeared to be well-healed with the pigment starting to return.  It was recommended that patient continue Vaseline daily to her left NAC and that she could continue with the Xeroform to her left inferior T-zone wound daily.  Today, patient reports she is doing really well.  She states that she still has some firmness to her right lateral breast which she has been massaging.  She states that she feels things are healing up nicely.  She denies any other issues or concerns today.  She does not report any infectious symptoms.  Chaperone present on exam.  On exam, patient is sitting upright in no acute distress.  Breasts are overall soft and symmetric.  There is some firmness noted to the right lateral breast and to the left lateral/inferior breast consistent with fat necrosis.  There are no overlying skin changes.  There are no obvious fluid collections on exam.  Both NAC's appear to be healing nicely with pigment starting to return to both of them.  Both NAC's appear to be completely healed with no open wounds.  There is still a very small half centimeter superficial wound noted to the left vertical limb incision.  Otherwise, incisions are well-healed.  There are no signs of infection on exam.  I  discussed with the patient that the firmness that she is feeling is most likely fat necrosis, discussed with her it may also be scar tissue.  Recommended that she continue with massage and monitoring the firmness.  I discussed with her that if she feels the firmness changes in size or becomes more firm, she should let us  know immediately.  She expressed understanding.  I discussed with the patient that she may apply Vaseline to the small superficial wound that she still has remaining.  She expressed understanding.  Will plan to see the patient back in about 2 months.  I instructed her to call if she has any questions or concerns about anything.

## 2024-08-10 ENCOUNTER — Ambulatory Visit: Admitting: Student

## 2024-08-22 ENCOUNTER — Ambulatory Visit: Admitting: Physician Assistant

## 2024-08-22 ENCOUNTER — Encounter: Payer: Self-pay | Admitting: Student

## 2024-08-22 VITALS — BP 118/78 | HR 83 | Ht 62.0 in | Wt 221.0 lb

## 2024-08-22 DIAGNOSIS — Z9889 Other specified postprocedural states: Secondary | ICD-10-CM

## 2024-08-22 DIAGNOSIS — Z09 Encounter for follow-up examination after completed treatment for conditions other than malignant neoplasm: Secondary | ICD-10-CM

## 2024-08-22 NOTE — Progress Notes (Deleted)
   Referring Provider Lenon Layman ORN, MD [address] [facility] [place],  [place] [address]   CC: No chief complaint on file.     Stacey Santos is an 58 y.o. female.  HPI: Patient is a 58 year old female with history of macromastia. She underwent bilateral breast reduction using amputation technique with Dr. Lowery on 03/09/2024.  Patient is about 5-1/2 months out from her breast reduction surgery.  She presents to the clinic today for postoperative follow-up.  Patient was last seen in the clinic on 06/09/2024.  At this visit, patient was doing really well.  On exam, breasts were overall soft and symmetric.  There was some firmness noted to the right lateral breast and the left lateral inferior breast consistent with fat necrosis.  NAC's were healing nicely with pigment starting to return to both of them.  There was still a very small superficial wound noted to the left vertical limb incision.  Otherwise incisions were well-healed.  Today,  Review of Systems General: ***  Physical Exam    06/09/2024    2:51 PM 05/18/2024   10:17 AM 04/26/2024   10:38 AM  Vitals with BMI  Height   5' 2  Weight   230 lbs  BMI   42.06  Systolic 121 121 877  Diastolic 83 75 81  Pulse 94 80 95    General:  No acute distress,  Alert and oriented, Non-Toxic, Normal speech and affect ***   Assessment/Plan ***  Stacey Santos 08/22/2024, 8:32 AM

## 2024-08-22 NOTE — Progress Notes (Signed)
   Referring Provider Lenon Layman ORN, MD [address] [facility] [place] - [place],  [place] [address]   CC:  Chief Complaint  Patient presents with   Follow-up     F/U Bil reduction / Ins / Sx 03-09-24       Stacey Santos is an 58 y.o. female.  HPI: Patient is a 59 year old female with history of macromastia. She underwent bilateral breast reduction using amputation technique with Dr. Lowery on 03/09/2024.  Patient is about 5-1/2 months out from her breast reduction surgery.  She presents to the clinic today for postoperative follow-up.  Patient was last seen in the clinic on 06/09/2024.  At this visit, patient was doing really well.  On exam, breasts were overall soft and symmetric.  There was some firmness noted to the right lateral breast and the left lateral inferior breast consistent with fat necrosis.  NAC's were healing nicely with pigment starting to return to both of them.  There was still a very small superficial wound noted to the left vertical limb incision.  Otherwise incisions were well-healed.  Today, patient is doing well.  She states that the left inferior T-zone wound has healed nicely.  As for the firmness laterally to bilateral breast, she reports that her husband has been mechanically massaging them regularly and notes that they are softening.  She has not yet begun silicone scar gel, but plans to.  She does feel as though the hyopigmentation has improved.  Overall she is pleased with the outcome of her reduction surgery.  She is a former runner and would like to start running again.   Review of Systems Skin: Endorses wound healing  Physical Exam    08/22/2024    9:01 AM 06/09/2024    2:51 PM 05/18/2024   10:17 AM  Vitals with BMI  Height 5' 2    Weight 221 lbs    BMI 40.41    Systolic 118 121 878  Diastolic 78 83 75  Pulse 83 94 80    General:  No acute distress,  Alert and oriented, Non-Toxic, Normal speech and affect MSK: Ambulatory Pulm:  No increased respiratory effort Breasts: Good shape and symmetry.  Soft throughout.  Left inferior T-zone wound has fully epithelialized.  It is scarring down slightly at that location, but otherwise excellent contour.  No discrete areas of firmness.  Small area of hypopigmentation involving left NAC, but greatly improved since last encounter.  Assessment/Plan  S/p bilateral breast reduction 03/2024:  She appears well-healed from a long-term follow-up.  She is scarring down a bit at the left inferior T-zone and there is a small area of residual hypopigmentation involving left NAC, but otherwise her exam is entirely benign.  Her areas of firmness has softened considerably and there is no indication for ultrasound workup.  Discussed silicone scar gel twice daily x 3 months.  Follow-up in 6 months if she is at all bothered by the contour at left inferior T-zone or if she has persistent hypopigmentation that may be amenable to tattoo restoration.  All questions answered.  Picture(s) obtained of the patient and placed in the chart were with the patient's or guardian's permission.   Stacey Santos 08/22/2024, 9:21 AM

## 2025-02-21 ENCOUNTER — Ambulatory Visit: Admitting: Plastic Surgery
